# Patient Record
Sex: Female | Born: 1965 | Race: Black or African American | Hispanic: No | Marital: Single | Smoking: Never smoker
Health system: Southern US, Community
[De-identification: ages and names within clinical notes are randomized; demographics above are authoritative.]

## PROBLEM LIST (undated history)

## (undated) DIAGNOSIS — M1990 Unspecified osteoarthritis, unspecified site: Secondary | ICD-10-CM

## (undated) DIAGNOSIS — I10 Essential (primary) hypertension: Secondary | ICD-10-CM

## (undated) DIAGNOSIS — J45909 Unspecified asthma, uncomplicated: Secondary | ICD-10-CM

## (undated) HISTORY — DX: Unspecified osteoarthritis, unspecified site: M19.90

## (undated) HISTORY — DX: Essential (primary) hypertension: I10

## (undated) HISTORY — DX: Unspecified asthma, uncomplicated: J45.909

---

## 1989-10-28 HISTORY — PX: THYROIDECTOMY: SHX17

## 1998-09-20 ENCOUNTER — Emergency Department (HOSPITAL_COMMUNITY): Admission: EM | Admit: 1998-09-20 | Discharge: 1998-09-21 | Payer: Self-pay | Admitting: Emergency Medicine

## 2000-09-13 ENCOUNTER — Emergency Department (HOSPITAL_COMMUNITY): Admission: EM | Admit: 2000-09-13 | Discharge: 2000-09-13 | Payer: Self-pay | Admitting: Emergency Medicine

## 2005-01-25 ENCOUNTER — Emergency Department (HOSPITAL_COMMUNITY): Admission: EM | Admit: 2005-01-25 | Discharge: 2005-01-25 | Payer: Self-pay | Admitting: Family Medicine

## 2005-02-01 ENCOUNTER — Ambulatory Visit: Payer: Self-pay | Admitting: Family Medicine

## 2005-02-01 ENCOUNTER — Ambulatory Visit: Payer: Self-pay | Admitting: Internal Medicine

## 2005-02-14 ENCOUNTER — Ambulatory Visit: Payer: Self-pay | Admitting: Family Medicine

## 2005-02-21 ENCOUNTER — Ambulatory Visit: Payer: Self-pay | Admitting: Internal Medicine

## 2005-02-25 ENCOUNTER — Ambulatory Visit: Payer: Self-pay | Admitting: Family Medicine

## 2005-02-26 ENCOUNTER — Ambulatory Visit: Payer: Self-pay | Admitting: *Deleted

## 2005-03-01 ENCOUNTER — Ambulatory Visit: Payer: Self-pay | Admitting: Family Medicine

## 2005-03-18 ENCOUNTER — Ambulatory Visit: Payer: Self-pay | Admitting: Family Medicine

## 2005-04-26 ENCOUNTER — Ambulatory Visit: Payer: Self-pay | Admitting: Internal Medicine

## 2006-01-24 ENCOUNTER — Emergency Department (HOSPITAL_COMMUNITY): Admission: EM | Admit: 2006-01-24 | Discharge: 2006-01-24 | Payer: Self-pay | Admitting: *Deleted

## 2006-06-08 ENCOUNTER — Emergency Department (HOSPITAL_COMMUNITY): Admission: EM | Admit: 2006-06-08 | Discharge: 2006-06-08 | Payer: Self-pay | Admitting: Emergency Medicine

## 2006-07-16 ENCOUNTER — Ambulatory Visit: Payer: Self-pay | Admitting: Family Medicine

## 2006-10-10 ENCOUNTER — Ambulatory Visit: Payer: Self-pay | Admitting: Nurse Practitioner

## 2007-01-12 ENCOUNTER — Ambulatory Visit: Payer: Self-pay | Admitting: Family Medicine

## 2007-04-06 ENCOUNTER — Ambulatory Visit: Payer: Self-pay | Admitting: Internal Medicine

## 2007-07-15 ENCOUNTER — Encounter (INDEPENDENT_AMBULATORY_CARE_PROVIDER_SITE_OTHER): Payer: Self-pay | Admitting: *Deleted

## 2007-08-01 ENCOUNTER — Emergency Department (HOSPITAL_COMMUNITY): Admission: EM | Admit: 2007-08-01 | Discharge: 2007-08-01 | Payer: Self-pay | Admitting: Emergency Medicine

## 2007-10-20 ENCOUNTER — Ambulatory Visit: Payer: Self-pay | Admitting: Internal Medicine

## 2008-01-06 ENCOUNTER — Ambulatory Visit: Payer: Self-pay | Admitting: Family Medicine

## 2008-01-12 ENCOUNTER — Ambulatory Visit: Payer: Self-pay | Admitting: Internal Medicine

## 2008-01-12 ENCOUNTER — Encounter (INDEPENDENT_AMBULATORY_CARE_PROVIDER_SITE_OTHER): Payer: Self-pay | Admitting: Family Medicine

## 2008-01-12 LAB — CONVERTED CEMR LAB
ALT: 11 units/L (ref 0–35)
AST: 13 units/L (ref 0–37)
Albumin: 3.9 g/dL (ref 3.5–5.2)
Alkaline Phosphatase: 84 units/L (ref 39–117)
BUN: 13 mg/dL (ref 6–23)
Basophils Absolute: 0 10*3/uL (ref 0.0–0.1)
Basophils Relative: 0 % (ref 0–1)
CO2: 24 meq/L (ref 19–32)
Calcium: 9.1 mg/dL (ref 8.4–10.5)
Chloride: 102 meq/L (ref 96–112)
Cholesterol: 185 mg/dL (ref 0–200)
Creatinine, Ser: 0.8 mg/dL (ref 0.40–1.20)
Eosinophils Absolute: 0.2 10*3/uL (ref 0.0–0.7)
Eosinophils Relative: 2 % (ref 0–5)
Glucose, Bld: 97 mg/dL (ref 70–99)
HCT: 37.2 % (ref 36.0–46.0)
HDL: 45 mg/dL (ref >39)
Hemoglobin: 10.7 g/dL — ABNORMAL LOW (ref 12.0–15.0)
LDL Cholesterol: 122 mg/dL — ABNORMAL HIGH (ref 0–99)
Lymphocytes Relative: 30 % (ref 12–46)
Lymphs Abs: 2.6 10*3/uL (ref 0.7–4.0)
MCHC: 28.8 g/dL — ABNORMAL LOW (ref 30.0–36.0)
MCV: 72.8 fL — ABNORMAL LOW (ref 78.0–100.0)
Monocytes Absolute: 0.7 10*3/uL (ref 0.1–1.0)
Monocytes Relative: 8 % (ref 3–12)
Neutro Abs: 5.3 10*3/uL (ref 1.7–7.7)
Neutrophils Relative %: 60 % (ref 43–77)
Platelets: 319 10*3/uL (ref 150–400)
Potassium: 4.2 meq/L (ref 3.5–5.3)
RBC: 5.11 M/uL (ref 3.87–5.11)
RDW: 15.6 % — ABNORMAL HIGH (ref 11.5–15.5)
Sodium: 138 meq/L (ref 135–145)
T3, Total: 178.4 ng/dL (ref 80.0–204.0)
T4, Total: 8.5 ug/dL (ref 5.0–12.5)
TSH: 2.958 microintl units/mL (ref 0.350–5.50)
Total Bilirubin: 0.5 mg/dL (ref 0.3–1.2)
Total CHOL/HDL Ratio: 4.1
Total Protein: 7.3 g/dL (ref 6.0–8.3)
Triglycerides: 90 mg/dL (ref <150)
VLDL: 18 mg/dL (ref 0–40)
WBC: 8.7 10*3/uL (ref 4.0–10.5)

## 2008-01-29 ENCOUNTER — Encounter (INDEPENDENT_AMBULATORY_CARE_PROVIDER_SITE_OTHER): Payer: Self-pay | Admitting: Family Medicine

## 2008-01-29 ENCOUNTER — Ambulatory Visit: Payer: Self-pay | Admitting: Internal Medicine

## 2008-01-29 LAB — CONVERTED CEMR LAB
Ferritin: 36 ng/mL (ref 10–291)
Hgb A2 Quant: 2.7 % (ref 2.2–3.2)
Hgb A: 97.3 % (ref 96.8–97.8)
Hgb F Quant: 0 % (ref 0.0–2.0)
Hgb S Quant: 0 % (ref 0.0–0.0)
Iron: 33 ug/dL — ABNORMAL LOW (ref 42–145)
Saturation Ratios: 12 % — ABNORMAL LOW (ref 20–55)
TIBC: 282 ug/dL (ref 250–470)
UIBC: 249 ug/dL

## 2008-03-07 ENCOUNTER — Ambulatory Visit: Payer: Self-pay | Admitting: Internal Medicine

## 2008-03-07 ENCOUNTER — Encounter: Payer: Self-pay | Admitting: Family Medicine

## 2008-03-07 LAB — CONVERTED CEMR LAB
Chlamydia, DNA Probe: NEGATIVE
GC Probe Amp, Genital: NEGATIVE

## 2008-06-30 ENCOUNTER — Ambulatory Visit: Payer: Self-pay | Admitting: Internal Medicine

## 2008-07-07 ENCOUNTER — Emergency Department (HOSPITAL_COMMUNITY): Admission: EM | Admit: 2008-07-07 | Discharge: 2008-07-07 | Payer: Self-pay | Admitting: Family Medicine

## 2008-07-21 ENCOUNTER — Ambulatory Visit: Payer: Self-pay | Admitting: Internal Medicine

## 2009-01-25 ENCOUNTER — Ambulatory Visit: Payer: Self-pay | Admitting: Family Medicine

## 2009-01-26 ENCOUNTER — Encounter (INDEPENDENT_AMBULATORY_CARE_PROVIDER_SITE_OTHER): Payer: Self-pay | Admitting: Internal Medicine

## 2009-03-17 ENCOUNTER — Ambulatory Visit: Payer: Self-pay | Admitting: Family Medicine

## 2009-07-21 ENCOUNTER — Emergency Department (HOSPITAL_COMMUNITY): Admission: EM | Admit: 2009-07-21 | Discharge: 2009-07-21 | Payer: Self-pay | Admitting: Emergency Medicine

## 2009-11-27 ENCOUNTER — Emergency Department (HOSPITAL_COMMUNITY): Admission: EM | Admit: 2009-11-27 | Discharge: 2009-11-27 | Payer: Self-pay | Admitting: Family Medicine

## 2011-01-04 ENCOUNTER — Other Ambulatory Visit (HOSPITAL_COMMUNITY): Payer: Self-pay | Admitting: Family Medicine

## 2011-01-04 DIAGNOSIS — Z1231 Encounter for screening mammogram for malignant neoplasm of breast: Secondary | ICD-10-CM

## 2011-01-13 LAB — POCT URINALYSIS DIP (DEVICE)
Bilirubin Urine: NEGATIVE
Glucose, UA: NEGATIVE mg/dL
Ketones, ur: 15 mg/dL — AB
Nitrite: NEGATIVE
pH: 5.5 (ref 5.0–8.0)

## 2011-01-14 ENCOUNTER — Ambulatory Visit (HOSPITAL_COMMUNITY)
Admission: RE | Admit: 2011-01-14 | Discharge: 2011-01-14 | Disposition: A | Discharge status: Home / Self Care | Payer: Self-pay | Source: Ambulatory Visit | Attending: Family Medicine | Admitting: Family Medicine

## 2011-01-14 DIAGNOSIS — Z1231 Encounter for screening mammogram for malignant neoplasm of breast: Secondary | ICD-10-CM | POA: Insufficient documentation

## 2011-02-20 ENCOUNTER — Other Ambulatory Visit: Payer: Self-pay | Admitting: Family Medicine

## 2011-05-26 ENCOUNTER — Emergency Department (HOSPITAL_COMMUNITY)
Admission: EM | Admit: 2011-05-26 | Discharge: 2011-05-27 | Disposition: A | Discharge status: Home / Self Care | Payer: Self-pay | Attending: Emergency Medicine | Admitting: Emergency Medicine

## 2011-05-26 DIAGNOSIS — N751 Abscess of Bartholin's gland: Secondary | ICD-10-CM | POA: Insufficient documentation

## 2011-06-03 ENCOUNTER — Emergency Department (HOSPITAL_COMMUNITY)
Admission: EM | Admit: 2011-06-03 | Discharge: 2011-06-03 | Disposition: A | Discharge status: Home / Self Care | Payer: Self-pay | Attending: Emergency Medicine | Admitting: Emergency Medicine

## 2011-06-03 DIAGNOSIS — Z09 Encounter for follow-up examination after completed treatment for conditions other than malignant neoplasm: Secondary | ICD-10-CM | POA: Insufficient documentation

## 2011-09-24 ENCOUNTER — Emergency Department (INDEPENDENT_AMBULATORY_CARE_PROVIDER_SITE_OTHER): Payer: Self-pay

## 2011-09-24 ENCOUNTER — Encounter: Payer: Self-pay | Admitting: Cardiology

## 2011-09-24 ENCOUNTER — Emergency Department (INDEPENDENT_AMBULATORY_CARE_PROVIDER_SITE_OTHER)
Admission: EM | Admit: 2011-09-24 | Discharge: 2011-09-24 | Disposition: A | Discharge status: Home / Self Care | Payer: Self-pay | Source: Home / Self Care | Attending: Emergency Medicine | Admitting: Emergency Medicine

## 2011-09-24 DIAGNOSIS — R6889 Other general symptoms and signs: Secondary | ICD-10-CM

## 2011-09-24 MED ORDER — BENZONATATE 200 MG PO CAPS: 200.0000 mg | ORAL_CAPSULE | Freq: Three times a day (TID) | ORAL | Status: AC | PRN

## 2011-09-24 MED ORDER — OSELTAMIVIR PHOSPHATE 75 MG PO CAPS: 75.0000 mg | ORAL_CAPSULE | Freq: Two times a day (BID) | ORAL | Status: AC

## 2011-09-24 MED ORDER — TRAMADOL HCL 50 MG PO TABS: 100.0000 mg | ORAL_TABLET | Freq: Three times a day (TID) | ORAL | Status: AC | PRN

## 2011-09-24 NOTE — ED Provider Notes (Signed)
History     CSN: [id number] Arrival date & time: 09/24/2011  8:27 PM   First MD Initiated Contact with Patient 09/24/11 1756      No chief complaint on file.   (Consider location/radiation/quality/duration/timing/severity/associated sxs/prior treatment) HPI Comments: Jodi Hunt is a two-day history chills, fever of up to 102.2, muscle aches, cough productive yellow sputum, scratchy throat, nasal congestion green drainage, and vomiting. Her sister has the same symptoms. She has not had a flu vaccine.   History reviewed. No pertinent past medical history.  Past Surgical History  Procedure Date  . Thyroidectomy 1991    History reviewed. No pertinent family history.  History  Substance Use Topics  . Smoking status: Not on file  . Smokeless tobacco: Not on file  . Alcohol Use:     OB History    Grav Para Term Preterm Abortions TAB SAB Ect Mult Living                  Review of Systems  Constitutional: Positive for fever, chills and fatigue.  HENT: Positive for congestion, sore throat and rhinorrhea. Negative for ear pain, sneezing, neck stiffness, voice change and postnasal drip.   Eyes: Negative for pain, discharge and redness.  Respiratory: Positive for cough. Negative for chest tightness, shortness of breath and wheezing.   Gastrointestinal: Positive for vomiting. Negative for nausea, abdominal pain and diarrhea.  Musculoskeletal: Positive for myalgias.  Skin: Negative for rash.  Neurological: Positive for headaches.    Allergies  Review of patient's allergies indicates no known allergies.  Home Medications   Current Outpatient Rx  Name Route Sig Dispense Refill  . ACETAMINOPHEN 500 MG PO TABS Oral Take 500 mg by mouth every 4 (four) hours as needed. 2 tablets every 4 hours as needed for fever/pain     . BENZONATATE 200 MG PO CAPS Oral Take 1 capsule (200 mg total) by mouth 3 (three) times daily as needed for cough. 30 capsule 0  . OSELTAMIVIR PHOSPHATE 75 MG  PO CAPS Oral Take 1 capsule (75 mg total) by mouth every 12 (twelve) hours. 10 capsule 0  . TRAMADOL HCL 50 MG PO TABS Oral Take 2 tablets (100 mg total) by mouth every 8 (eight) hours as needed for pain. Maximum dose= 8 tablets per day 30 tablet 0    BP 123/91  Pulse 112  Temp(Src) 99.5 F (37.5 C) (Oral)  Resp 20  SpO2 100%  LMP 08/29/2011  Physical Exam  Nursing note and vitals reviewed. Constitutional: She appears well-developed and well-nourished. No distress.  HENT:  Head: Normocephalic and atraumatic.  Right Ear: External ear normal.  Left Ear: External ear normal.  Nose: Nose normal.  Mouth/Throat: Oropharynx is clear and moist. No oropharyngeal exudate.  Eyes: Conjunctivae and EOM are normal. Pupils are equal, round, and reactive to light. Right eye exhibits no discharge. Left eye exhibits no discharge.  Neck: Normal range of motion. Neck supple.  Cardiovascular: Normal rate, regular rhythm and normal heart sounds.   Pulmonary/Chest: Effort normal and breath sounds normal. No stridor. No respiratory distress. She has no wheezes. She has no rales. She exhibits no tenderness.  Lymphadenopathy:    She has no cervical adenopathy.  Skin: Skin is warm and dry. No rash noted. She is not diaphoretic.    ED Course  Procedures (including critical care time)  Labs Reviewed - No data to display Dg Chest 2 View  09/24/2011  *RADIOLOGY REPORT*  Clinical Data: Cough.  Fever.  Body aches.  CHEST - 2 VIEW  Comparison: None.  Findings: Cardiac and mediastinal contours appear normal.  The lungs appear clear.  No pleural effusion is identified.  IMPRESSION:  No significant abnormality identified.  Original Report Authenticated By: Dellia Cloud, M.D.     1. Influenza-like illness       MDM  She has a flulike illness and we'll go ahead and treat with Tamiflu.        Roque Lias, MD 09/24/11 2214

## 2011-09-24 NOTE — ED Notes (Signed)
Pt reports body aches, fever, sore/scratchy throat, productive yellow cough, and nasal congestion. Symptoms started Sunday night. Pt has not taken tylenol today but did yesterday.  Tolerating po liquids. Poor appetite.

## 2012-02-05 IMAGING — MG MM DIGITAL SCREENING {WH}
7 series · 7 of 7 positions shown · non-contrast
Comparison: none

DG SCREEN MAMMOGRAM BILATERAL
Bilateral CC and MLO view(s) were taken.
Technologist: Lavren Zh, RT RM

DIGITAL SCREENING MAMMOGRAM WITH CAD:
There are scattered fibroglandular densities.  No masses or malignant type calcifications are 
identified.
Images were processed with CAD.

[R CC]
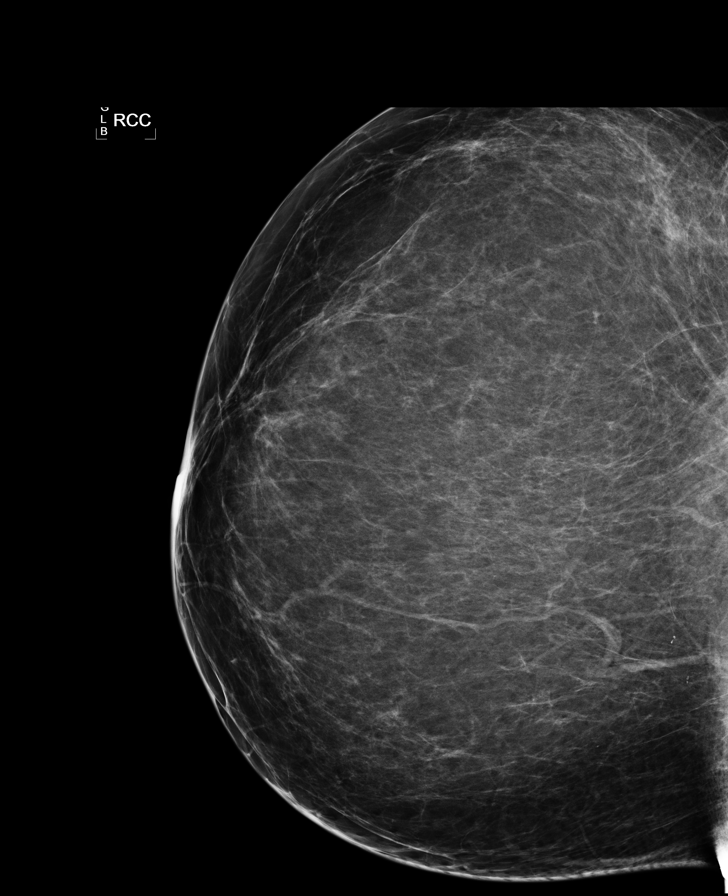

[R MLO (1 of 2)]
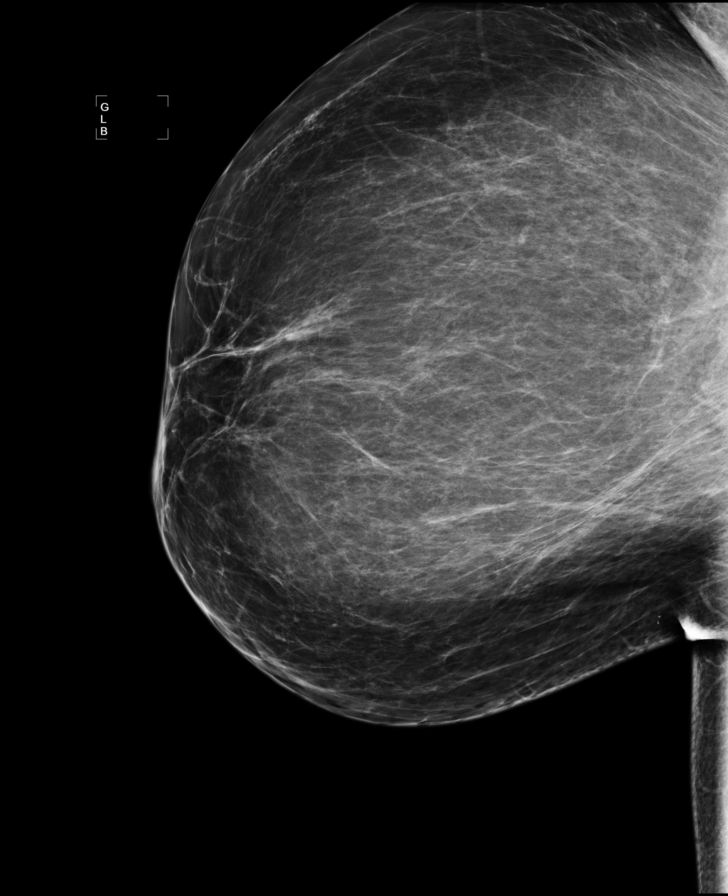

[L CC]
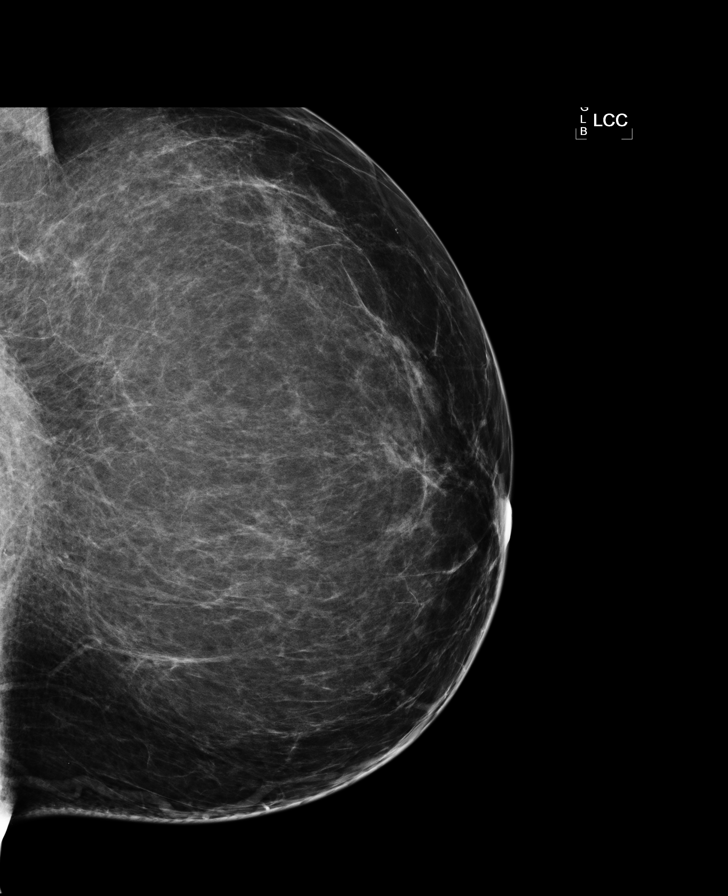

[L MLO (1 of 3)]
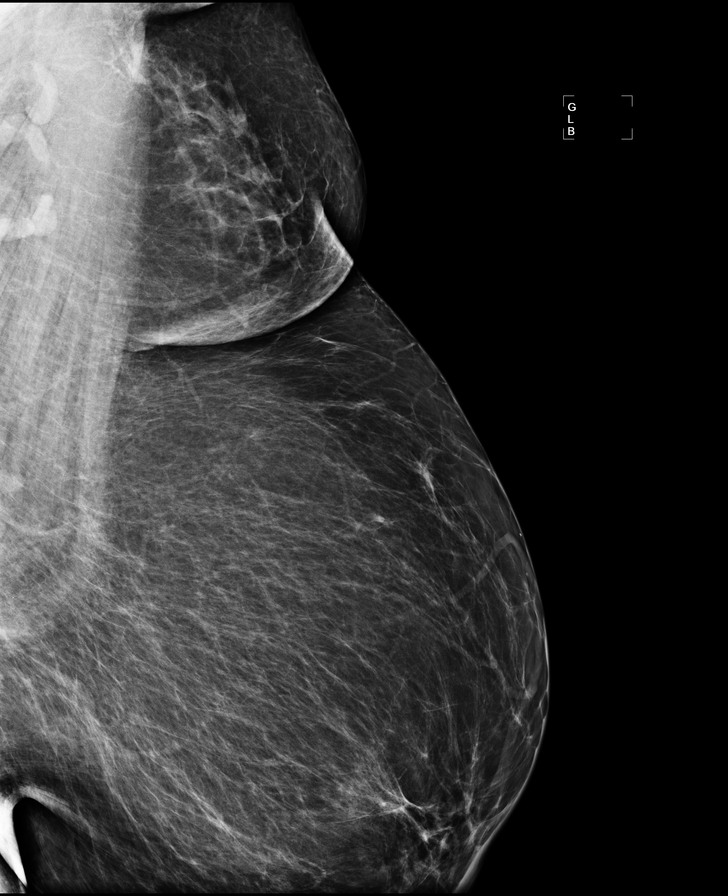

[L MLO (2 of 3)]
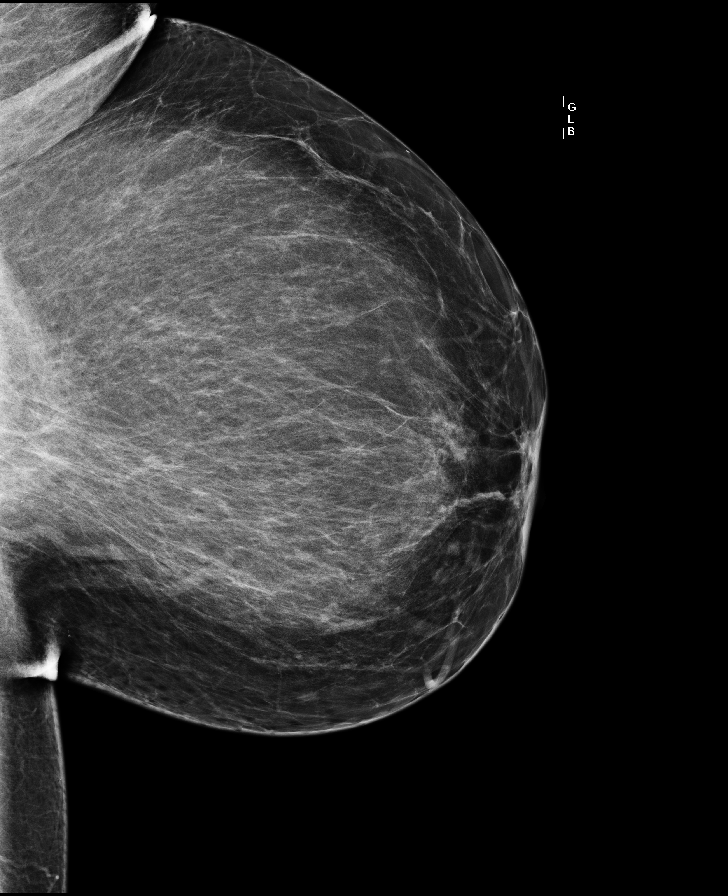

[R MLO (2 of 2)]
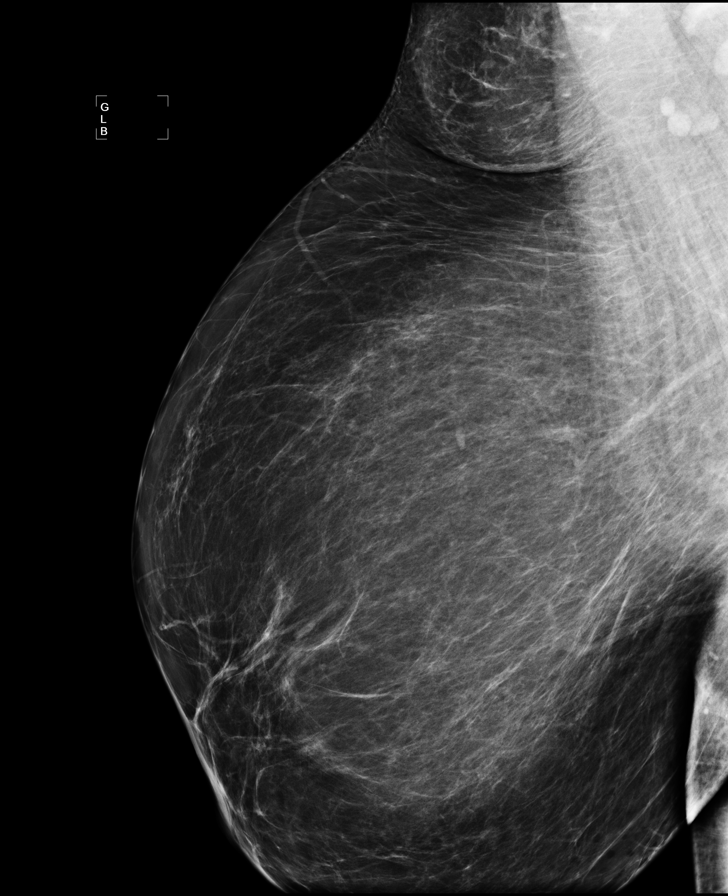

[L MLO (3 of 3)]
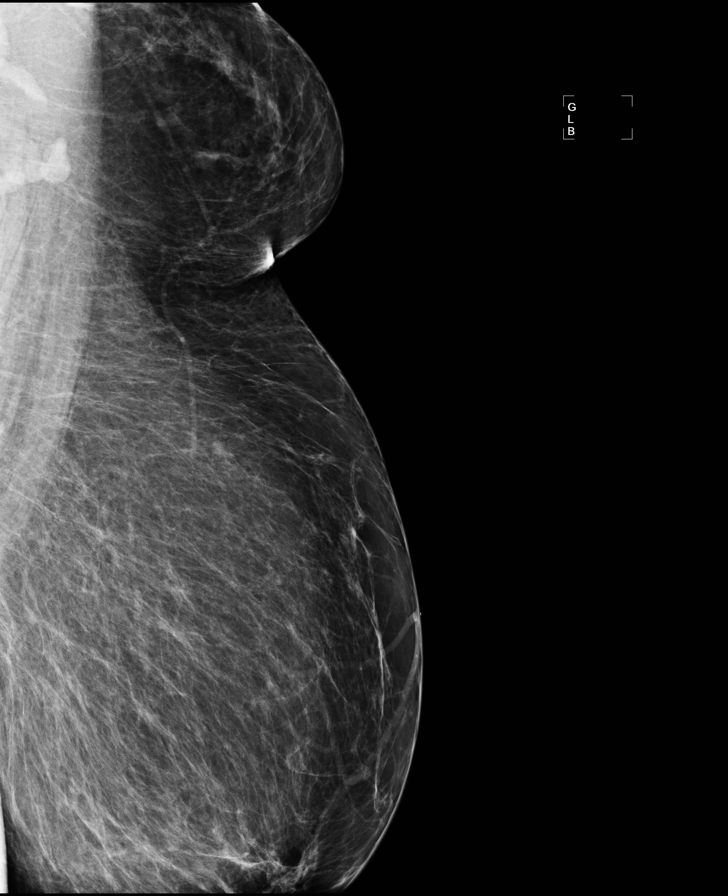

[7 of 7 positions shown; findings below may reference images not displayed]

IMPRESSION: No specific mammographic evidence of malignancy.  Next screening mammogram is recommended in one 
year.

A result letter of this screening mammogram will be mailed directly to the patient.

ASSESSMENT: Negative - BI-RADS 1

Screening mammogram in 1 year.
,

## 2012-10-15 IMAGING — CR DG CHEST 2V
2 series · 2 of 2 positions shown · non-contrast
Comparison: None.

CLINICAL DATA: Cough.  Fever.  Body aches.

CHEST - 2 VIEW

[view not recorded (1 of 2)]
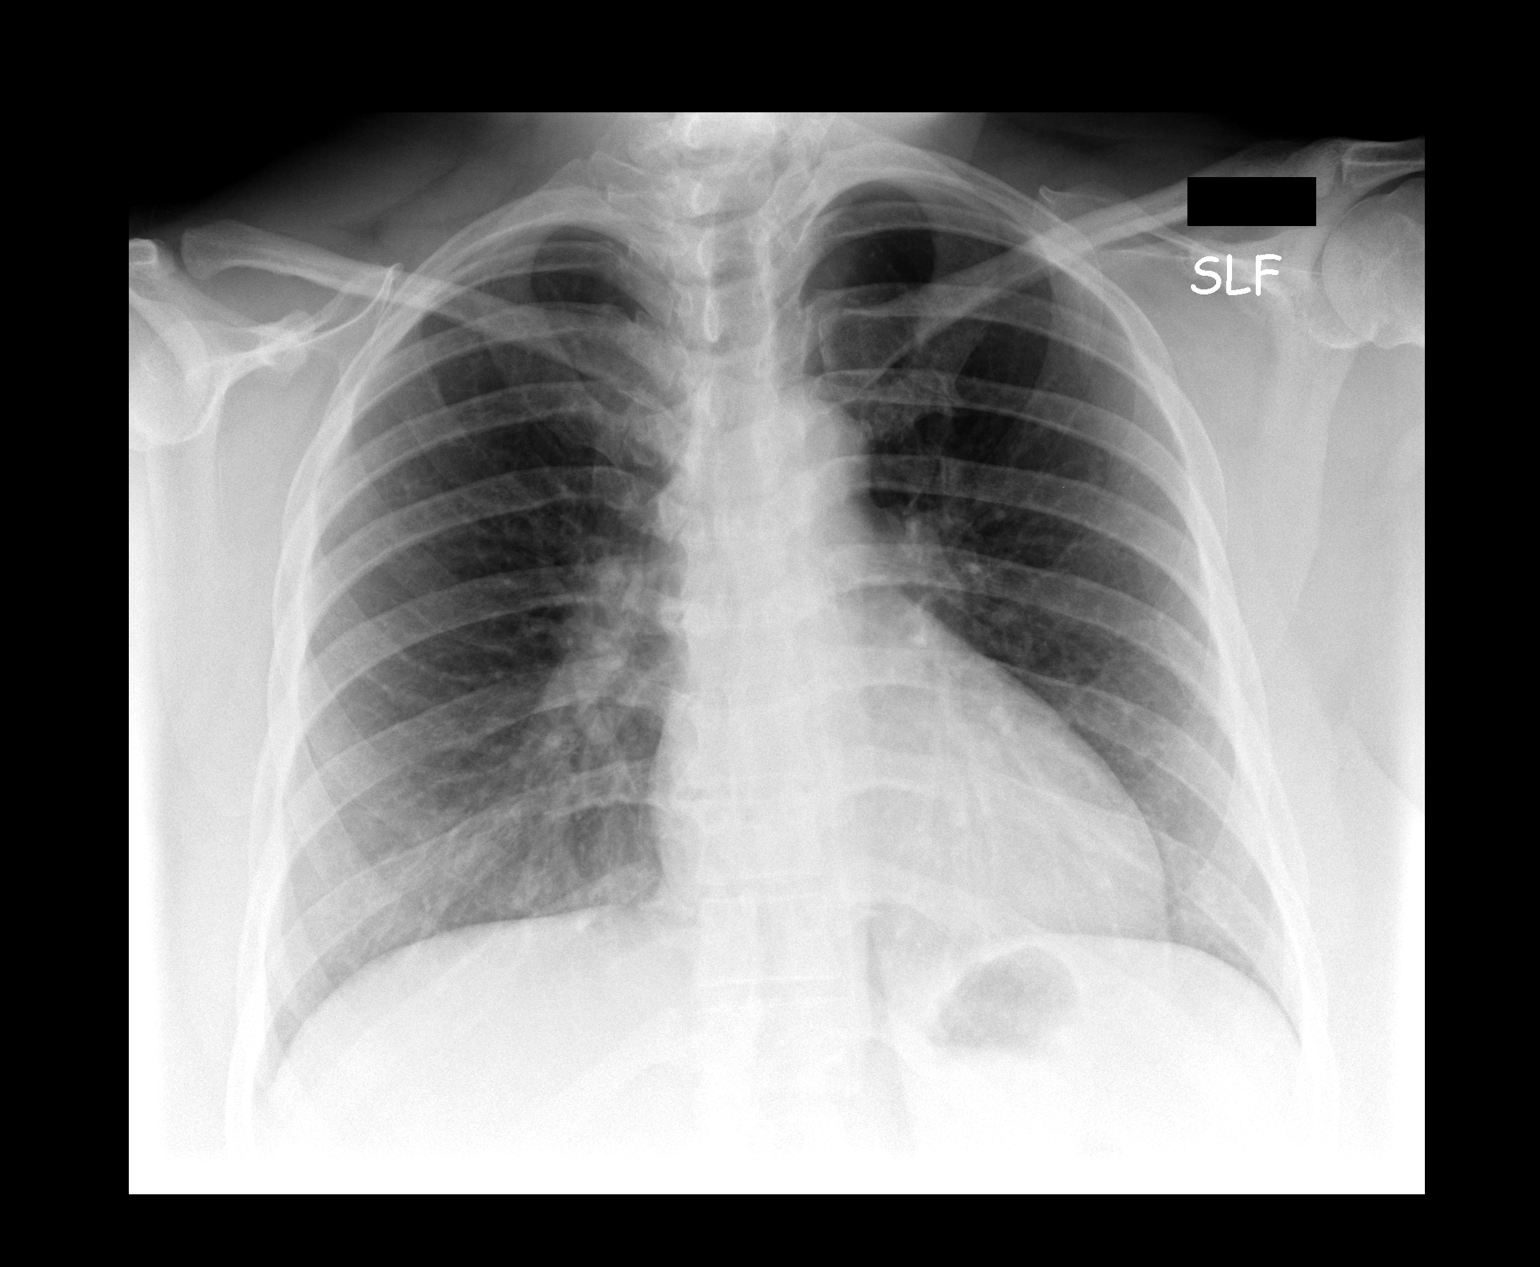

[view not recorded (2 of 2)]
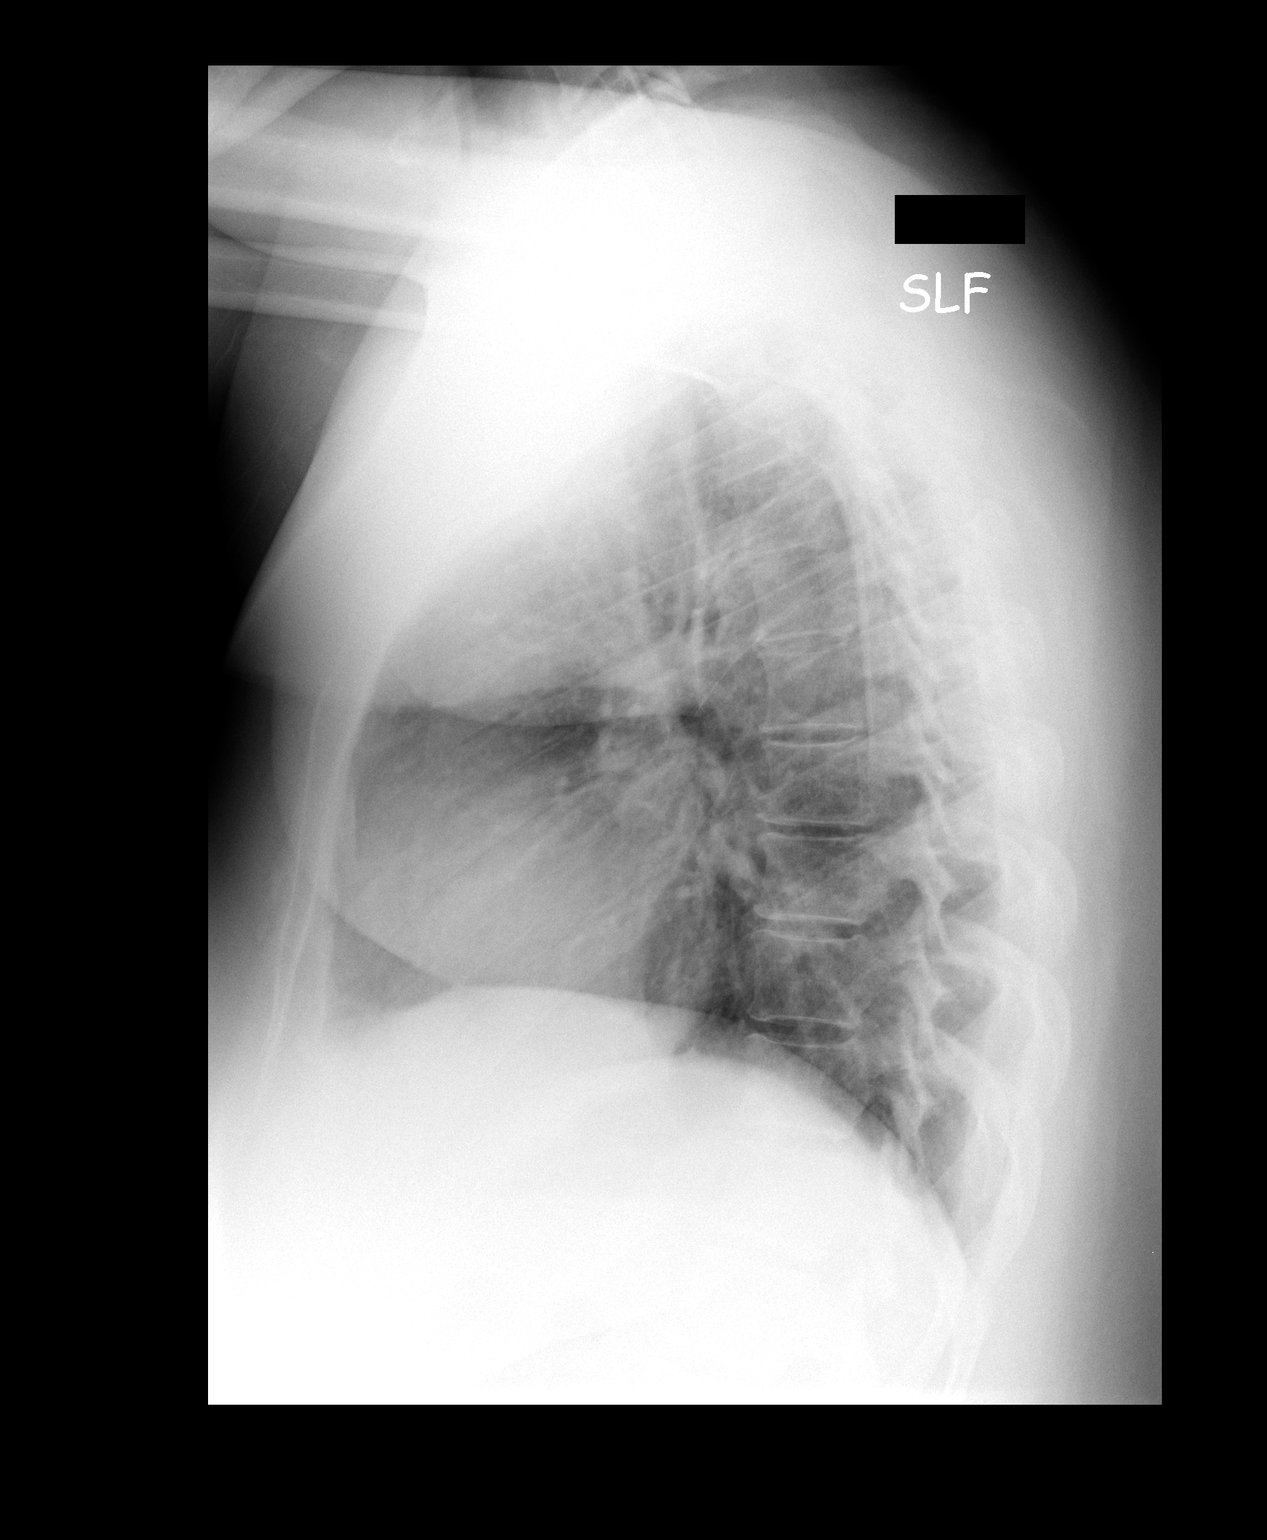

[2 of 2 positions shown; findings below may reference images not displayed]

FINDINGS: Cardiac and mediastinal contours appear normal.

The lungs appear clear.

No pleural effusion is identified.
IMPRESSION: No significant abnormality identified.

## 2012-12-14 ENCOUNTER — Encounter (HOSPITAL_COMMUNITY): Payer: Self-pay | Admitting: *Deleted

## 2012-12-14 ENCOUNTER — Emergency Department (HOSPITAL_COMMUNITY): Payer: Self-pay

## 2012-12-14 ENCOUNTER — Emergency Department (HOSPITAL_COMMUNITY)
Admission: EM | Admit: 2012-12-14 | Discharge: 2012-12-14 | Disposition: A | Discharge status: Home / Self Care | Payer: Self-pay | Attending: Emergency Medicine | Admitting: Emergency Medicine

## 2012-12-14 DIAGNOSIS — R5381 Other malaise: Secondary | ICD-10-CM | POA: Insufficient documentation

## 2012-12-14 DIAGNOSIS — J40 Bronchitis, not specified as acute or chronic: Secondary | ICD-10-CM

## 2012-12-14 DIAGNOSIS — J209 Acute bronchitis, unspecified: Secondary | ICD-10-CM | POA: Insufficient documentation

## 2012-12-14 DIAGNOSIS — R509 Fever, unspecified: Secondary | ICD-10-CM | POA: Insufficient documentation

## 2012-12-14 DIAGNOSIS — J3489 Other specified disorders of nose and nasal sinuses: Secondary | ICD-10-CM | POA: Insufficient documentation

## 2012-12-14 MED ORDER — ALBUTEROL SULFATE HFA 108 (90 BASE) MCG/ACT IN AERS
2.0000 | INHALATION_SPRAY | Freq: Once | RESPIRATORY_TRACT | Status: AC
  Administered 2012-12-14: 2 via RESPIRATORY_TRACT
  Filled 2012-12-14: qty 6.7

## 2012-12-14 MED ORDER — PREDNISONE 10 MG PO TABS: ORAL_TABLET | ORAL | Status: DC

## 2012-12-14 MED ORDER — BENZONATATE 100 MG PO CAPS: 100.0000 mg | ORAL_CAPSULE | Freq: Three times a day (TID) | ORAL | Status: AC

## 2012-12-14 MED ORDER — AZITHROMYCIN 250 MG PO TABS
500.0000 mg | ORAL_TABLET | Freq: Once | ORAL | Status: AC
  Administered 2012-12-14: 500 mg via ORAL
  Filled 2012-12-14: qty 2

## 2012-12-14 MED ORDER — PREDNISONE 20 MG PO TABS
60.0000 mg | ORAL_TABLET | Freq: Once | ORAL | Status: AC
  Administered 2012-12-14: 60 mg via ORAL
  Filled 2012-12-14: qty 3

## 2012-12-14 MED ORDER — AZITHROMYCIN 250 MG PO TABS: ORAL_TABLET | ORAL | Status: DC

## 2012-12-14 NOTE — ED Notes (Signed)
Pt in c/o cough and congestion over last week, had a fever earlier in the week but none in last two days, states in due to continued coughing

## 2012-12-14 NOTE — ED Provider Notes (Signed)
History  This chart was scribed for non-physician practitioner, Lottie Mussel, PA working with Vida Roller, MD by Shari Heritage, ED Scribe. This patient was seen in room WTR9/WTR9 and the patient's care was started at 2059.   CSN: [id number]  Arrival date & time 12/14/12  2022   First MD Initiated Contact with Patient 12/14/12 2059      Chief Complaint  Patient presents with  . Cough     Patient is a 47 y.o. female presenting with cough. The history is provided by the patient. No language interpreter was used.  Cough Cough characteristics:  Productive Sputum characteristics:  Yellow Severity:  Moderate Onset quality:  Gradual Duration:  1 week Timing:  Constant Progression:  Unchanged Chronicity:  New Smoker: no   Relieved by:  Nothing Associated symptoms: fever and rhinorrhea   Associated symptoms: no shortness of breath   Fever:    Timing:  Intermittent   Temp source:  Subjective Rhinorrhea:    Quality:  Yellow   Severity:  Mild   Duration:  1 week   Timing:  Constant    HPI Comments: Jodi Hunt is a 47 y.o. female who presents to the Emergency Department complaining of persistent, moderate, productive cough onset 1 week ago. Cough is productive of yellow sputum. There is associated congestion, sinus pressure, rhinorrhea, intermittent subjective fever and generalized weakness. Patient states that she hasn't had a fever for the past couple of days. Patient denies shortness of breath, nausea, vomiting or diarrhea. No leg swelling. She has taken Nyquil, Alka Seltzer Cold, Tylenol Cold and Flu and other cough suppressants and decongestants.  Patient has no history of asthma or lung problems. She does not smoke.   History reviewed. No pertinent past medical history.  Past Surgical History  Procedure Laterality Date  . Thyroidectomy  1991    History reviewed. No pertinent family history.  History  Substance Use Topics  . Smoking status: Not on file   . Smokeless tobacco: Not on file  . Alcohol Use:     OB History   Grav Para Term Preterm Abortions TAB SAB Ect Mult Living                  Review of Systems  Constitutional: Positive for fever.  HENT: Positive for congestion, rhinorrhea and sinus pressure.   Respiratory: Positive for cough. Negative for shortness of breath.   Cardiovascular: Negative for leg swelling.  Gastrointestinal: Negative for nausea, vomiting and diarrhea.  All other systems reviewed and are negative.    Allergies  Review of patient's allergies indicates no known allergies.  Home Medications  No current outpatient prescriptions on file.  Triage Vitals: BP 142/87  Pulse 95  Temp(Src) 98.6 F (37 C) (Oral)  Resp 20  SpO2 100%  LMP 11/28/2012  Physical Exam  Constitutional: She is oriented to person, place, and time. She appears well-developed and well-nourished.  HENT:  Head: Normocephalic and atraumatic.  Right Ear: Tympanic membrane, external ear and ear canal normal.  Left Ear: Tympanic membrane, external ear and ear canal normal.  Nose: Right sinus exhibits maxillary sinus tenderness and frontal sinus tenderness. Left sinus exhibits maxillary sinus tenderness and frontal sinus tenderness.  Mouth/Throat: Posterior oropharyngeal erythema present. No oropharyngeal exudate or posterior oropharyngeal edema.  Erythematous tonsils. Tonsils are not enlarged.   Cardiovascular: Normal rate, regular rhythm and normal heart sounds.   No murmur heard. Pulmonary/Chest: Effort normal and breath sounds normal. No respiratory distress. She has  no wheezes. She has no rales.  Neurological: She is alert and oriented to person, place, and time.    ED Course  Procedures (including critical care time) DIAGNOSTIC STUDIES: Oxygen Saturation is 100% on room air, normal by my interpretation.    COORDINATION OF CARE: 9:27 PM- Patient informed of current plan for treatment and evaluation and agrees with plan at  this time.      Chest 2 View  12/14/2012  *RADIOLOGY REPORT*  Clinical Data: Cough, short of breath  CHEST - 2 VIEW  Comparison: Prior chest x-ray 09/24/2011  Findings: The lungs are well-aerated and free from pulmonary edema, focal airspace consolidation or pulmonary nodule.  Cardiac and mediastinal contours are within normal limits.  No pneumothorax, or pleural effusion. No acute osseous findings.  IMPRESSION:  No acute cardiopulmonary disease.   Original Report Authenticated By: Malachy Moan, M.D.      Labs Reviewed - No data to display No results found.   1. Bronchitis       MDM  PT with cough, congestion for 1 week. CXR negative. Pt with dry constant cough in ED. Lungs clear. Mild improvement with albuterol. Will cover with zithromax for atypical infection, inhaler at home, steroid course. Follow up with PCP. VS normal.   Filed Vitals:   12/14/12 2032  BP: 142/87  Pulse: 95  Temp: 98.6 F (37 C)  Resp: 20        I personally performed the services described in this documentation, which was scribed in my presence. The recorded information has been reviewed and is accurate.    Lottie Mussel, PA 12/14/12 2244

## 2012-12-15 NOTE — ED Provider Notes (Signed)
  Medical screening examination/treatment/procedure(s) were performed by non-physician practitioner and as supervising physician I was immediately available for consultation/collaboration.    Willi Borowiak D Arvel Oquinn, MD 12/15/12 0024 

## 2013-06-27 ENCOUNTER — Encounter (HOSPITAL_COMMUNITY): Payer: Self-pay | Admitting: Emergency Medicine

## 2013-06-27 ENCOUNTER — Emergency Department (HOSPITAL_COMMUNITY)
Admission: EM | Admit: 2013-06-27 | Discharge: 2013-06-27 | Disposition: A | Discharge status: Home / Self Care | Payer: BC Managed Care – PPO | Source: Home / Self Care

## 2013-06-27 DIAGNOSIS — L259 Unspecified contact dermatitis, unspecified cause: Secondary | ICD-10-CM

## 2013-06-27 MED ORDER — TRIAMCINOLONE ACETONIDE 40 MG/ML IJ SUSP
40.0000 mg | Freq: Once | INTRAMUSCULAR | Status: AC
  Administered 2013-06-27: 40 mg via INTRAMUSCULAR

## 2013-06-27 MED ORDER — TRIAMCINOLONE ACETONIDE 40 MG/ML IJ SUSP
INTRAMUSCULAR | Status: AC
  Filled 2013-06-27: qty 1

## 2013-06-27 MED ORDER — PREDNISONE 20 MG PO TABS: ORAL_TABLET | ORAL | Status: DC

## 2013-06-27 MED ORDER — TRIAMCINOLONE ACETONIDE 0.1 % EX CREA: TOPICAL_CREAM | CUTANEOUS | Status: DC

## 2013-06-27 NOTE — ED Provider Notes (Signed)
Medical screening examination/treatment/procedure(s) were performed by non-physician practitioner and as supervising physician I was immediately available for consultation/collaboration.  Karimah Winquist, M.D.  Leonardo Plaia C Lashika Erker, MD 06/27/13 2002 

## 2013-06-27 NOTE — ED Notes (Signed)
C/o rash for over a week now.  No medications taken.

## 2013-06-27 NOTE — ED Provider Notes (Signed)
CSN: [id number]     Arrival date & time 06/27/13  1843 History   None    Chief Complaint  Patient presents with  . Rash   (Consider location/radiation/quality/duration/timing/severity/associated sxs/prior Treatment) HPI Comments: 47 year old female presents with an itchy rash for one week. She states she developed pruritus to various skin surface areas and approximately  3-4 days later she developed a papular rash scattered about the arms, ,  proximal thighs and toso.  She has pruritus on her torso, however I do not see a rash there. She is unaware of the cause. She denies being outside or around poison ivy or other known poisonous  plants. She has no trouble breathing, no cough, wheezing or other symptoms.  Patient is a 47 y.o. female presenting with rash.  Rash   History reviewed. No pertinent past medical history. Past Surgical History  Procedure Laterality Date  . Thyroidectomy  1991   History reviewed. No pertinent family history. History  Substance Use Topics  . Smoking status: Not on file  . Smokeless tobacco: Not on file  . Alcohol Use:    OB History   Grav Para Term Preterm Abortions TAB SAB Ect Mult Living                 Review of Systems  Constitutional: Negative.   HENT: Negative.   Respiratory: Negative.   Cardiovascular: Negative.   Gastrointestinal: Negative.   Skin: Positive for rash.  Neurological: Negative.   Psychiatric/Behavioral: Negative.     Allergies  Review of patient's allergies indicates no known allergies.  Home Medications   Current Outpatient Rx  Name  Route  Sig  Dispense  Refill  . azithromycin (ZITHROMAX) 250 MG tablet      One tab PO QD   4 tablet   0   . benzonatate (TESSALON) 100 MG capsule   Oral   Take 1 capsule (100 mg total) by mouth every 8 (eight) hours.   21 capsule   0   . dextromethorphan (DELSYM) 30 MG/5ML liquid   Oral   Take 60 mg by mouth as needed for cough.         Marland Kitchen DM-Phenylephrine-Acetaminophen  (ALKA-SELTZER PLUS DAY COLD/FLU PO)   Oral   Take by mouth.         Marland Kitchen OVER THE COUNTER MEDICATION   Oral   Take 1 tablet by mouth daily as needed (congestion.). Over the counter decongestant.         . Phenylephrine-DM-GG-APAP (TYLENOL COLD MULTI-SYMPTOM) 5-10-200-325 MG TABS   Oral   Take by mouth.         . predniSONE (DELTASONE) 10 MG tablet      Take 5 tab day 1, take 4 tab day 2, take 3 tab day 3, take 2 tab day 4, and take 1 tab day 5   15 tablet   0   . predniSONE (DELTASONE) 20 MG tablet      Take 3 tabs po on first day, 2 tabs second day, 2 tabs third day, 1 tab fourth day, 1 tab 5th day. Take with food.   9 tablet   0   . Pseudoeph-Doxylamine-DM-APAP (NYQUIL) 60-7.03-26-999 MG/30ML LIQD   Oral   Take 30 mLs by mouth daily as needed.         . triamcinolone cream (KENALOG) 0.1 %      Apply to affected areas 2 or 3 times a day as directed   30 g  0    BP 123/74  Pulse 93  Temp(Src) 99.2 F (37.3 C) (Oral)  Resp 18  SpO2 98%  LMP 06/13/2013 Physical Exam  Nursing note and vitals reviewed. Constitutional: She is oriented to person, place, and time. She appears well-developed and well-nourished. No distress.  HENT:  Mouth/Throat: Oropharynx is clear and moist. No oropharyngeal exudate.  Eyes: Conjunctivae and EOM are normal.  Neck: Normal range of motion. Neck supple.  Cardiovascular: Normal rate, regular rhythm and normal heart sounds.   Pulmonary/Chest: Effort normal and breath sounds normal. No respiratory distress. She has no wheezes. She has no rales.  Neurological: She is alert and oriented to person, place, and time.  Skin: Skin is warm and dry. Rash noted.  Papular rash to skin areas as described above. No drainage or pustules.  Psychiatric: She has a normal mood and affect.    ED Course  Procedures (including critical care time) Labs Review Labs Reviewed - No data to display Imaging Review No results found.  MDM   1. Contact  dermatitis    Kenalog 40 mg IM Start tomorrow prednisone as directed Benadryl gell and triamcinolone cream as directed    Hayden Rasmussen, NP 06/27/13 1937

## 2014-01-05 IMAGING — CR DG CHEST 2V
2 series · 2 of 2 positions shown · non-contrast
Comparison: Prior chest x-ray 09/24/2011

CLINICAL DATA: Cough, short of breath

CHEST - 2 VIEW

[w chest pa]
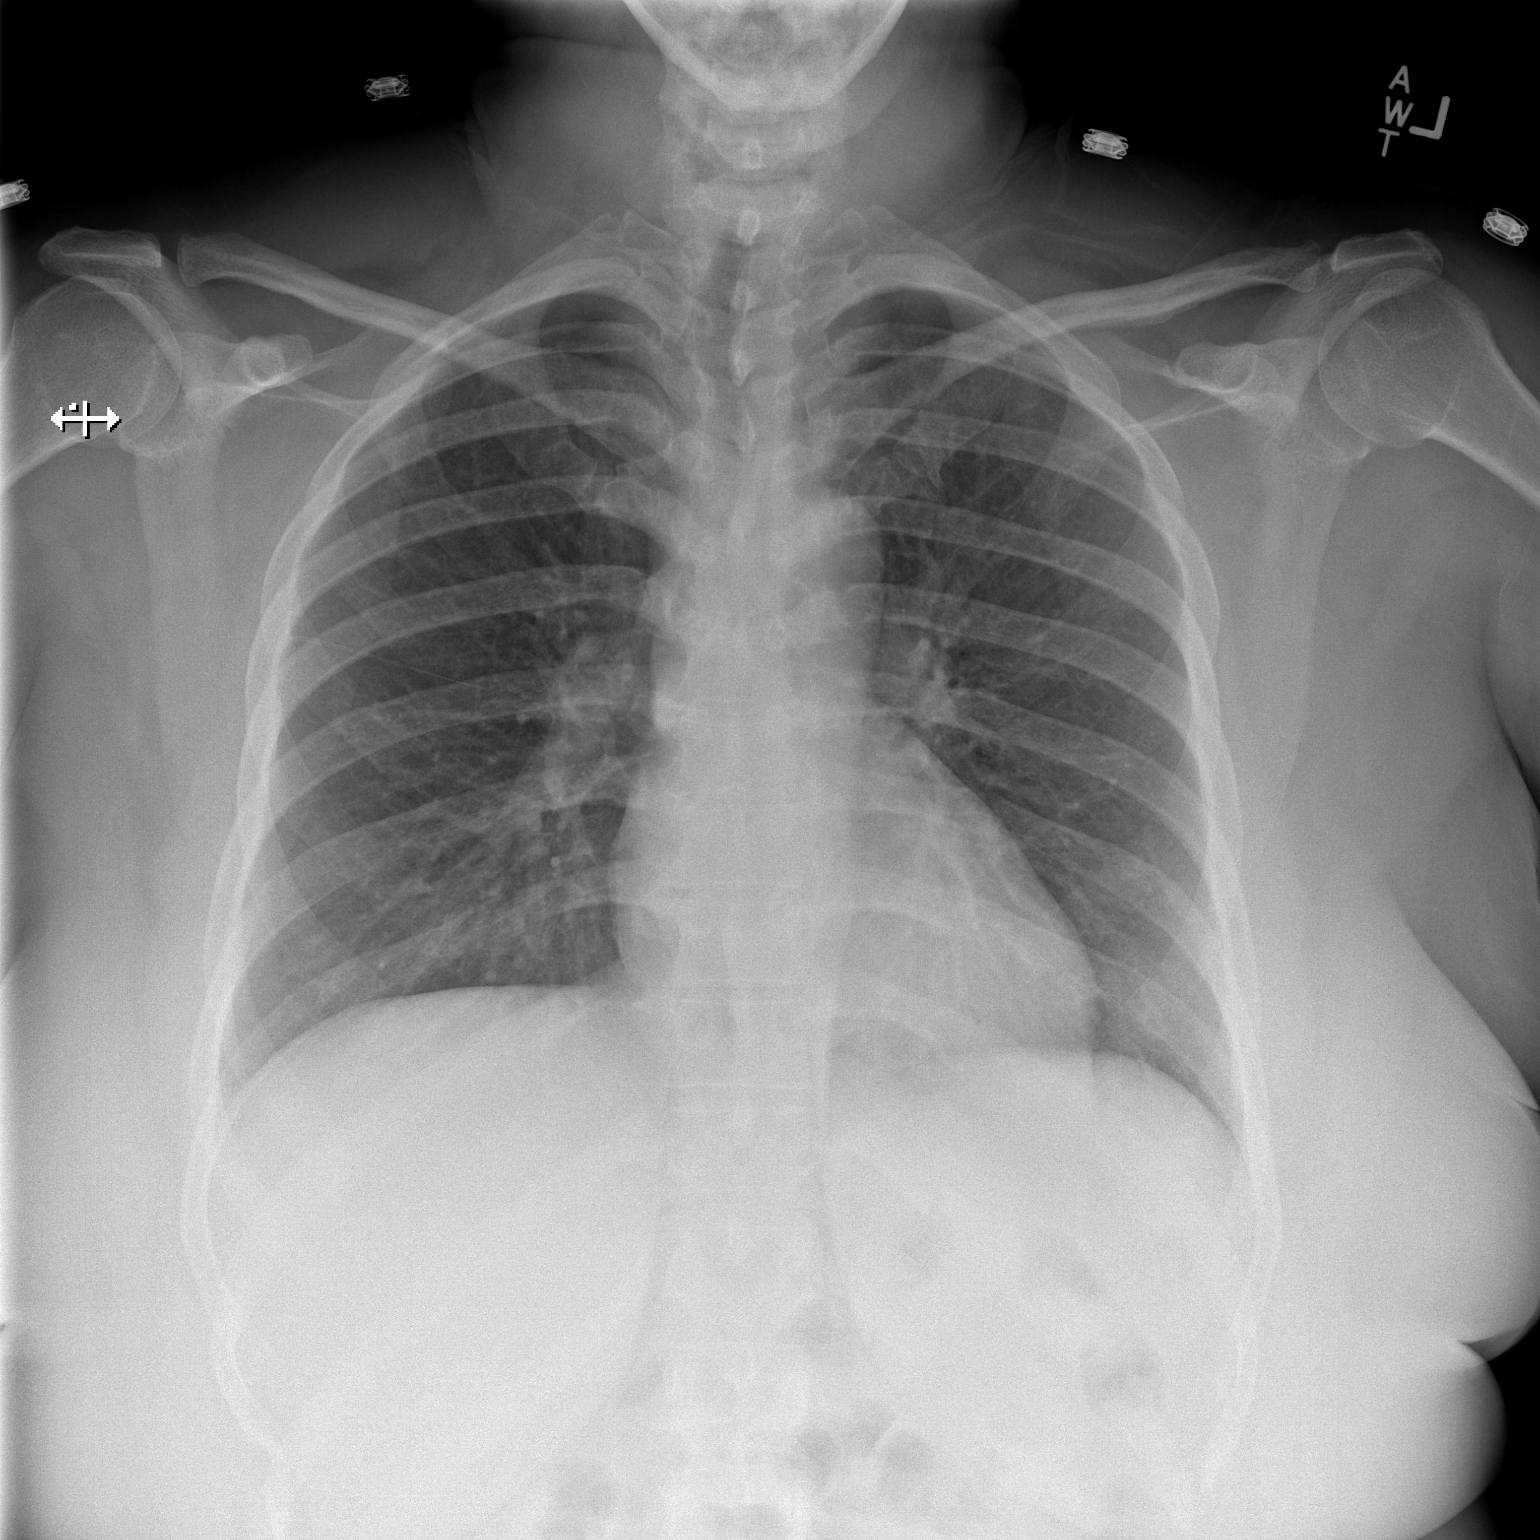

[w chest lat]
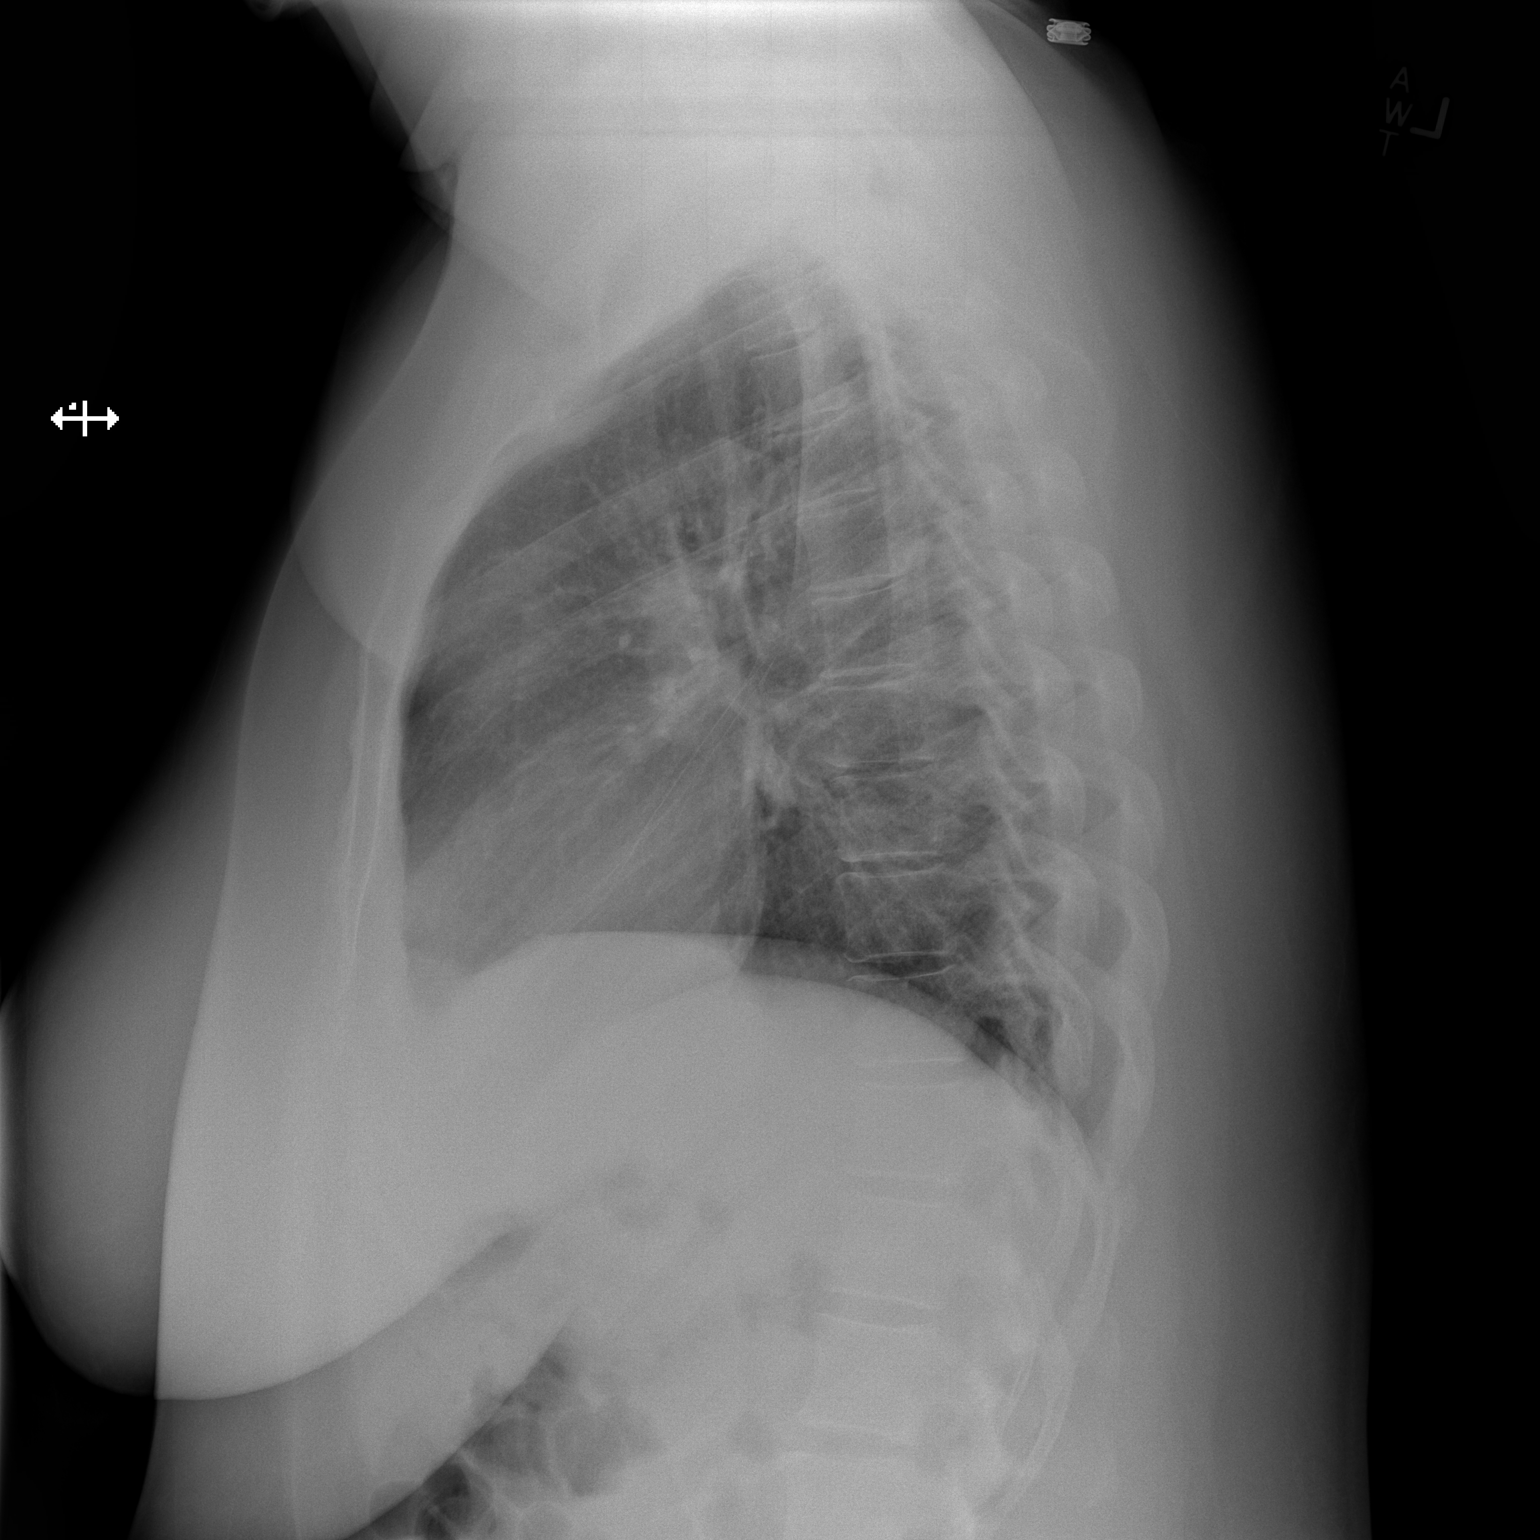

[2 of 2 positions shown; findings below may reference images not displayed]

FINDINGS: [The lungs are well-aerated and free from pulmonary
edema, focal airspace consolidation or pulmonary nodule.  Cardiac
and mediastinal contours are within normal limits.  No
pneumothorax, or pleural effusion. No acute osseous findings.]
IMPRESSION: No acute cardiopulmonary disease.

## 2014-03-30 ENCOUNTER — Other Ambulatory Visit (HOSPITAL_COMMUNITY)
Admission: RE | Admit: 2014-03-30 | Discharge: 2014-03-30 | Disposition: A | Discharge status: Home / Self Care | Payer: BC Managed Care – PPO | Source: Ambulatory Visit | Attending: Family Medicine | Admitting: Family Medicine

## 2014-03-30 ENCOUNTER — Other Ambulatory Visit: Payer: Self-pay | Admitting: Family Medicine

## 2014-03-30 DIAGNOSIS — Z113 Encounter for screening for infections with a predominantly sexual mode of transmission: Secondary | ICD-10-CM | POA: Insufficient documentation

## 2014-03-30 DIAGNOSIS — Z01419 Encounter for gynecological examination (general) (routine) without abnormal findings: Secondary | ICD-10-CM | POA: Insufficient documentation

## 2014-03-31 LAB — CYTOLOGY - PAP

## 2016-04-24 ENCOUNTER — Other Ambulatory Visit: Payer: Self-pay | Admitting: Family Medicine

## 2016-04-24 DIAGNOSIS — Z1231 Encounter for screening mammogram for malignant neoplasm of breast: Secondary | ICD-10-CM

## 2016-05-08 ENCOUNTER — Ambulatory Visit: Payer: Self-pay

## 2017-01-03 ENCOUNTER — Ambulatory Visit (INDEPENDENT_AMBULATORY_CARE_PROVIDER_SITE_OTHER): Payer: Self-pay | Admitting: Nurse Practitioner

## 2017-01-03 VITALS — BP 126/80 | HR 107 | Temp 99.1°F | Wt 247.6 lb

## 2017-01-03 DIAGNOSIS — J0100 Acute maxillary sinusitis, unspecified: Secondary | ICD-10-CM

## 2017-01-03 DIAGNOSIS — J209 Acute bronchitis, unspecified: Secondary | ICD-10-CM

## 2017-01-03 MED ORDER — HYDROCODONE-HOMATROPINE 5-1.5 MG/5ML PO SYRP: 5.0000 mL | ORAL_SOLUTION | Freq: Three times a day (TID) | ORAL | 0 refills | Status: AC | PRN

## 2017-01-03 MED ORDER — AMOXICILLIN-POT CLAVULANATE 875-125 MG PO TABS: 1.0000 | ORAL_TABLET | Freq: Two times a day (BID) | ORAL | 0 refills | Status: AC

## 2017-01-03 MED ORDER — FLUTICASONE PROPIONATE 50 MCG/ACT NA SUSP: 2.0000 | Freq: Every day | NASAL | 0 refills | Status: AC

## 2017-01-03 MED ORDER — BENZONATATE 100 MG PO CAPS: 100.0000 mg | ORAL_CAPSULE | Freq: Three times a day (TID) | ORAL | 0 refills | Status: AC | PRN

## 2017-01-03 MED ORDER — PREDNISONE 10 MG (21) PO TBPK: ORAL_TABLET | ORAL | 0 refills | Status: AC

## 2017-01-03 NOTE — Progress Notes (Signed)
Patient ID: Jodi Hunt, female   DOB: [birth year], 51 y.o.   MRN: [id number] Subjective:  Jodi Hunt is a 51 y.o. female who presents for evaluation of URI like symptoms.  Symptoms include congestion, cough described as productive, fever: low grade fevers and chills, night sweats, post nasal drip, sinus pain and sore throat.  Onset of symptoms was 5 days ago, and has been gradually worsening since that time.  Treatment to date:  Mucinex and sinus medication..  High risk factors for influenza complications:  works in child care. .  The following portions of the patient's history were reviewed and updated as appropriate:  allergies, current medications and past medical history.  Constitutional: positive for chills, fatigue, fevers and sweats, negative for anorexia Eyes: negative Ears, nose, mouth, throat, and face: positive for earaches, nasal congestion and sore throat, negative for ear drainage Respiratory: positive for cough, wheezing and history of bronchitis, negative for asthma Cardiovascular: negative Allergic/Immunologic: positive for hay fever Objective:  BP 126/80 (BP Location: Right Arm, Patient Position: Sitting, Cuff Size: Large)   Pulse (!) 107   Temp 99.1 F (37.3 C) (Oral)   Wt 247 lb 9.6 oz (112.3 kg)  General appearance: alert, cooperative, fatigued, moderate distress and due to cough Head: Normocephalic, without obvious abnormality, atraumatic Eyes: conjunctivae/corneas clear. PERRL, EOM's intact. Fundi benign. Ears: normal TM's and external ear canals both ears and TM without erythema or bulging, fluid-filled bilaterally Nose: clear discharge, mild congestion, moderate maxillary sinus tenderness bilateral, mild frontal sinus tenderness bilateral Throat: lips, mucosa, and tongue normal; teeth and gums normal Lungs: clear to auscultation bilaterally and uncontrollable coughing during exam Heart: regular rate and rhythm, S1, S2 normal, no murmur, click, rub or  gallop Neurologic: Alert and oriented X 3, normal strength and tone. Normal symmetric reflexes. Normal coordination and gait    Assessment:  sinusitis and bronchitis    Plan:  Discussed diagnosis and treatment of URI. Discussed diagnosis and treatment of sinusitis. Supportive care with appropriate antipyretics and fluids. Nasal steroids per orders. Tylenol or Ibuprofen for fever as directed.  Increase fluids and rest.  Reviewed indications for patient to go to ER, difficulty breathing, increased SOB or worsening cough.  Follow up in 2-3 days if no improvement.

## 2017-01-03 NOTE — Patient Instructions (Addendum)
Sinusitis, Adult Sinusitis is soreness and inflammation of your sinuses. Sinuses are hollow spaces in the bones around your face. Your sinuses are located:  Around your eyes.  In the middle of your forehead.  Behind your nose.  In your cheekbones. Your sinuses and nasal passages are lined with a stringy fluid (mucus). Mucus normally drains out of your sinuses. When your nasal tissues become inflamed or swollen, the mucus can become trapped or blocked so air cannot flow through your sinuses. This allows bacteria, viruses, and funguses to grow, which leads to infection. Sinusitis can develop quickly and last for 7?10 days (acute) or for more than 12 weeks (chronic). Sinusitis often develops after a cold. What are the causes? This condition is caused by anything that creates swelling in the sinuses or stops mucus from draining, including:  Allergies.  Asthma.  Bacterial or viral infection.  Abnormally shaped bones between the nasal passages.  Nasal growths that contain mucus (nasal polyps).  Narrow sinus openings.  Pollutants, such as chemicals or irritants in the air.  A foreign object stuck in the nose.  A fungal infection. This is rare. What increases the risk? The following factors may make you more likely to develop this condition:  Having allergies or asthma.  Having had a recent cold or respiratory tract infection.  Having structural deformities or blockages in your nose or sinuses.  Having a weak immune system.  Doing a lot of swimming or diving.  Overusing nasal sprays.  Smoking. What are the signs or symptoms? The main symptoms of this condition are pain and a feeling of pressure around the affected sinuses. Other symptoms include:  Upper toothache.  Earache.  Headache.  Bad breath.  Decreased sense of smell and taste.  A cough that may get worse at night.  Fatigue.  Fever.  Thick drainage from your nose. The drainage is often green and it may  contain pus (purulent).  Stuffy nose or congestion.  Postnasal drip. This is when extra mucus collects in the throat or back of the nose.  Swelling and warmth over the affected sinuses.  Sore throat.  Sensitivity to light. How is this diagnosed? This condition is diagnosed based on symptoms, a medical history, and a physical exam. To find out if your condition is acute or chronic, your health care provider may:  Look in your nose for signs of nasal polyps.  Tap over the affected sinus to check for signs of infection.  View the inside of your sinuses using an imaging device that has a light attached (endoscope). If your health care provider suspects that you have chronic sinusitis, you may also:  Be tested for allergies.  Have a sample of mucus taken from your nose (nasal culture) and checked for bacteria.  Have a mucus sample examined to see if your sinusitis is related to an allergy. If your sinusitis does not respond to treatment and it lasts longer than 8 weeks, you may have an MRI or CT scan to check your sinuses. These scans also help to determine how severe your infection is. In rare cases, a bone biopsy may be done to rule out more serious types of fungal sinus disease. How is this treated? Treatment for sinusitis depends on the cause and whether your condition is chronic or acute. If a virus is causing your sinusitis, your symptoms will go away on their own within 10 days. You may be given medicines to relieve your symptoms, including:  Topical nasal decongestants. They   shrink swollen nasal passages and let mucus drain from your sinuses.  Antihistamines. These drugs block inflammation that is triggered by allergies. This can help to ease swelling in your nose and sinuses.  Topical nasal corticosteroids. These are nasal sprays that ease inflammation and swelling in your nose and sinuses.  Nasal saline washes. These rinses can help to get rid of thick mucus in your  nose. If your condition is caused by bacteria, you will be given an antibiotic medicine. If your condition is caused by a fungus, you will be given an antifungal medicine. Surgery may be needed to correct underlying conditions, such as narrow nasal passages. Surgery may also be needed to remove polyps. Follow these instructions at home: Medicines  Take, use, or apply over-the-counter and prescription medicines only as told by your health care provider. These may include nasal sprays.  If you were prescribed an antibiotic medicine, take it as told by your health care provider. Do not stop taking the antibiotic even if you start to feel better. Hydrate and Humidify  Drink enough water to keep your urine clear or pale yellow. Staying hydrated will help to thin your mucus.  Use a cool mist humidifier to keep the humidity level in your home above 50%.  Inhale steam for 10-15 minutes, 3-4 times a day or as told by your health care provider. You can do this in the bathroom while a hot shower is running.  Limit your exposure to cool or dry air. Rest  Rest as much as possible.  Sleep with your head raised (elevated).  Make sure to get enough sleep each night. General instructions  Apply a warm, moist washcloth to your face 3-4 times a day or as told by your health care provider. This will help with discomfort.  Wash your hands often with soap and water to reduce your exposure to viruses and other germs. If soap and water are not available, use hand sanitizer.  Do not smoke. Avoid being around people who are smoking (secondhand smoke).  Keep all follow-up visits as told by your health care provider. This is important. Contact a health care provider if:  You have a fever.  Your symptoms get worse.  Your symptoms do not improve within 10 days. Get help right away if:  You have a severe headache.  You have persistent vomiting.  You have pain or swelling around your face or  eyes.  You have vision problems.  You develop confusion.  Your neck is stiff.  You have trouble breathing. This information is not intended to replace advice given to you by your health care provider. Make sure you discuss any questions you have with your health care provider. Document Released: 10/14/2005 Document Revised: 06/09/2016 Document Reviewed: 08/09/2015 Elsevier Interactive Patient Education  2017 [employer] Acute Bronchitis, Adult Acute bronchitis is sudden (acute) swelling of the air tubes (bronchi) in the lungs. Acute bronchitis causes these tubes to fill with mucus, which can make it hard to breathe. It can also cause coughing or wheezing. In adults, acute bronchitis usually goes away within 2 weeks. A cough caused by bronchitis may last up to 3 weeks. Smoking, allergies, and asthma can make the condition worse. Repeated episodes of bronchitis may cause further lung problems, such as chronic obstructive pulmonary disease (COPD). What are the causes? This condition can be caused by germs and by substances that irritate the lungs, including:  Cold and flu viruses. This condition is most often caused by the same   virus that causes a cold.  Bacteria.  Exposure to tobacco smoke, dust, fumes, and air pollution. What increases the risk? This condition is more likely to develop in people who:  Have close contact with someone with acute bronchitis.  Are exposed to lung irritants, such as tobacco smoke, dust, fumes, and vapors.  Have a weak immune system.  Have a respiratory condition such as asthma. What are the signs or symptoms? Symptoms of this condition include:  A cough.  Coughing up clear, yellow, or green mucus.  Wheezing.  Chest congestion.  Shortness of breath.  A fever.  Body aches.  Chills.  A sore throat. How is this diagnosed? This condition is usually diagnosed with a physical exam. During the exam, your health care provider may order  tests, such as chest X-rays, to rule out other conditions. He or she may also:  Test a sample of your mucus for bacterial infection.  Check the level of oxygen in your blood. This is done to check for pneumonia.  Do a chest X-ray or lung function testing to rule out pneumonia and other conditions.  Perform blood tests. Your health care provider will also ask about your symptoms and medical history. How is this treated? Most cases of acute bronchitis clear up over time without treatment. Your health care provider may recommend:  Drinking more fluids. Drinking more makes your mucus thinner, which may make it easier to breathe.  Taking a medicine for a fever or cough.  Taking an antibiotic medicine.  Using an inhaler to help improve shortness of breath and to control a cough.  Using a cool mist vaporizer or humidifier to make it easier to breathe. Follow these instructions at home: Medicines  Take over-the-counter and prescription medicines only as told by your health care provider.  If you were prescribed an antibiotic, take it as told by your health care provider. Do not stop taking the antibiotic even if you start to feel better. General instructions  Get plenty of rest.  Drink enough fluids to keep your urine clear or pale yellow.  Avoid smoking and secondhand smoke. Exposure to cigarette smoke or irritating chemicals will make bronchitis worse. If you smoke and you need help quitting, ask your health care provider. Quitting smoking will help your lungs heal faster.  Use an inhaler, cool mist vaporizer, or humidifier as told by your health care provider.  Keep all follow-up visits as told by your health care provider. This is important. How is this prevented? To lower your risk of getting this condition again:  Wash your hands often with soap and water. If soap and water are not available, use hand sanitizer.  Avoid contact with people who have cold symptoms.  Try not  to touch your hands to your mouth, nose, or eyes.  Make sure to get the flu shot every year. Contact a health care provider if:  Your symptoms do not improve in 2 weeks of treatment. Get help right away if:  You cough up blood.  You have chest pain.  You have severe shortness of breath.  You become dehydrated.  You faint or keep feeling like you are going to faint.  You keep vomiting.  You have a severe headache.  Your fever or chills gets worse. This information is not intended to replace advice given to you by your health care provider. Make sure you discuss any questions you have with your health care provider. Document Released: 11/21/2004 Document Revised: 05/08/2016 Document Reviewed:   04/03/2016 Elsevier Interactive Patient Education  2017 [employer] Inc[employer]  

## 2017-01-07 ENCOUNTER — Telehealth: Payer: Self-pay | Admitting: Nurse Practitioner

## 2017-01-07 NOTE — Telephone Encounter (Signed)
Called patient to follow up.  Number in chart is no longer a working number.

## 2018-02-12 ENCOUNTER — Other Ambulatory Visit: Payer: Self-pay | Admitting: Family Medicine

## 2018-02-12 DIAGNOSIS — Z1231 Encounter for screening mammogram for malignant neoplasm of breast: Secondary | ICD-10-CM

## 2019-11-03 ENCOUNTER — Other Ambulatory Visit: Payer: Self-pay

## 2019-11-03 DIAGNOSIS — Z20822 Contact with and (suspected) exposure to covid-19: Secondary | ICD-10-CM

## 2019-11-04 LAB — NOVEL CORONAVIRUS, NAA: SARS-CoV-2, NAA: NOT DETECTED

## 2022-06-05 ENCOUNTER — Encounter (HOSPITAL_BASED_OUTPATIENT_CLINIC_OR_DEPARTMENT_OTHER): Payer: Self-pay

## 2022-06-05 ENCOUNTER — Other Ambulatory Visit: Payer: Self-pay

## 2022-06-05 ENCOUNTER — Emergency Department (HOSPITAL_BASED_OUTPATIENT_CLINIC_OR_DEPARTMENT_OTHER): Payer: Self-pay

## 2022-06-05 ENCOUNTER — Emergency Department (HOSPITAL_BASED_OUTPATIENT_CLINIC_OR_DEPARTMENT_OTHER)
Admission: EM | Admit: 2022-06-05 | Discharge: 2022-06-05 | Disposition: A | Discharge status: Home / Self Care | Payer: Worker's Compensation | Attending: Emergency Medicine | Admitting: Emergency Medicine

## 2022-06-05 DIAGNOSIS — Y92210 Daycare center as the place of occurrence of the external cause: Secondary | ICD-10-CM | POA: Insufficient documentation

## 2022-06-05 DIAGNOSIS — W19XXXA Unspecified fall, initial encounter: Secondary | ICD-10-CM | POA: Diagnosis not present

## 2022-06-05 DIAGNOSIS — J45909 Unspecified asthma, uncomplicated: Secondary | ICD-10-CM | POA: Diagnosis not present

## 2022-06-05 DIAGNOSIS — I10 Essential (primary) hypertension: Secondary | ICD-10-CM | POA: Diagnosis not present

## 2022-06-05 DIAGNOSIS — Y990 Civilian activity done for income or pay: Secondary | ICD-10-CM | POA: Diagnosis not present

## 2022-06-05 DIAGNOSIS — M25561 Pain in right knee: Secondary | ICD-10-CM | POA: Insufficient documentation

## 2022-06-05 MED ORDER — LIDOCAINE 5 % EX PTCH
1.0000 | MEDICATED_PATCH | CUTANEOUS | Status: DC
  Administered 2022-06-05: 1 via TRANSDERMAL
  Filled 2022-06-05: qty 1

## 2022-06-05 NOTE — ED Provider Notes (Signed)
[facility] EMERGENCY DEPARTMENT Provider Note   CSN: [id number] Arrival date & time: 06/05/22  0841     History  Chief Complaint  Patient presents with   Knee Pain    Jodi Hunt is a 56 y.o. female.  HPI 57 year old female history of hypertension and asthma presents to the ER with complaints of right knee pain.  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  She landed onto her right knee.  She initially was having pain with weightbearing but has progressively able to bear more weight as time is passed.  She has taken ibuprofen for pain.  Denies any numbness or tingling.  Denies any head injury or LOC.    Home Medications Prior to Admission medications   Medication Sig Start Date End Date Taking? Authorizing Provider  benzonatate (TESSALON) 100 MG capsule Take 1 capsule (100 mg total) by mouth every 8 (eight) hours. Patient not taking: Reported on 01/03/2017 12/14/12   Jaynie Crumble, PA-C  fluticasone (FLONASE) 50 MCG/ACT nasal spray Place 2 sprays into both nostrils daily. 01/03/17 01/13/17  Leath-Warren, Sadie Haber, NP      Allergies    Patient has no known allergies.    Review of Systems   Review of Systems Ten systems reviewed and are negative for acute change, except as noted in the HPI.   Physical Exam Updated Vital Signs BP (!) 129/51 (BP Location: Right Arm)   Pulse 78   Temp 98.7 F (37.1 C) (Oral)   Resp 18   Ht 5\' 5"  (1.651 m)   Wt 102.1 kg   SpO2 95%   BMI 37.44 kg/m  Physical Exam Vitals and nursing note reviewed.  Constitutional:      General: She is not in acute distress.    Appearance: She is well-developed.  HENT:     Head: Normocephalic and atraumatic.  Eyes:     Conjunctiva/sclera: Conjunctivae normal.  Cardiovascular:     Rate and Rhythm: Normal rate and regular rhythm.     Heart sounds: No murmur heard. Pulmonary:     Effort: Pulmonary effort is normal. No  respiratory distress.     Breath sounds: Normal breath sounds.  Abdominal:     Palpations: Abdomen is soft.     Tenderness: There is no abdominal tenderness.  Musculoskeletal:        General: Swelling and tenderness present. No deformity.     Cervical back: Neck supple.     Comments: No tibial plateau tenderness, generalized tenderness to the lateral aspects of the knee  Skin:    General: Skin is warm and dry.     Capillary Refill: Capillary refill takes less than 2 seconds.  Neurological:     Mental Status: She is alert.  Psychiatric:        Mood and Affect: Mood normal.     ED Results / Procedures / Treatments   Labs (all labs ordered are listed, but only abnormal results are displayed) Labs Reviewed - No data to display  EKG None  Radiology DG Knee Complete 4 Views Right  Result Date: 06/05/2022 CLINICAL DATA:  Right knee pain after fall. EXAM: RIGHT KNEE - COMPLETE 4+ VIEW COMPARISON:  July 21, 2009. FINDINGS: No evidence of fracture, dislocation, or joint effusion. No evidence of arthropathy or other focal bone abnormality. Soft tissues are unremarkable. IMPRESSION: Negative. Electronically Signed   By: July 23, 2009.D.  On: 06/05/2022 09:41    Procedures Procedures    Medications Ordered in ED Medications  lidocaine (LIDODERM) 5 % 1 patch (1 patch Transdermal Patch Applied 06/05/22 0915)    ED Course/ Medical Decision Making/ A&P                           Medical Decision Making Amount and/or Complexity of Data Reviewed Radiology: ordered.  Risk Prescription drug management.   56 year old female presenting with right knee pain after fall.  Her x-rays are negative for fracture.  No head injury. She has no tibial plateau tenderness and my concern for tibial plateau fracture is low.  No evidence of septic joint or gout.  Per discussion with the patient, will provide a knee sleeve.  Encouraged RICE.  Encouraged her to follow-up with her PCP or orthopedics  if her pain persist.  She was understanding and is agreeable.  We discussed return precautions.  Stable for discharge. Final Clinical Impression(s) / ED Diagnoses Final diagnoses:  Fall, initial encounter  Acute pain of right knee    Rx / DC Orders ED Discharge Orders     None         Mare Ferrari, PA-C 06/05/22 1610    Pricilla Loveless, MD 06/06/22 [phone]

## 2022-06-05 NOTE — ED Notes (Signed)
Discharge instructions reviewed with patient. Patient verbalizes understanding, no further questions at this time. Medications and follow up information provided. No acute distress noted at time of departure.  

## 2022-06-05 NOTE — Discharge Instructions (Signed)
You were evaluated in the Emergency Department and after careful evaluation, we did not find any emergent condition requiring admission or further testing in the hospital.  Rest - please stay off ankle as much as possible Ice - ice for 20 minutes at a time, several times a day Compression - wear brace to provide support Elevate - elevate ankle above level of heart Ibuprofen - take with food. Take up to 3-4 times daily   Please return to the Emergency Department if you experience any worsening of your condition.  We encourage you to follow up with a primary care provider.  Thank you for allowing [place] to be a part of your care.

## 2022-06-05 NOTE — ED Triage Notes (Signed)
Pt states she fell at work, complain of knee pain. Denies LOC and headache.

## 2022-06-17 ENCOUNTER — Encounter: Payer: Self-pay | Admitting: Sports Medicine

## 2022-06-17 ENCOUNTER — Ambulatory Visit (INDEPENDENT_AMBULATORY_CARE_PROVIDER_SITE_OTHER): Payer: Worker's Compensation | Admitting: Sports Medicine

## 2022-06-17 ENCOUNTER — Ambulatory Visit: Payer: Self-pay

## 2022-06-17 DIAGNOSIS — W19XXXA Unspecified fall, initial encounter: Secondary | ICD-10-CM | POA: Diagnosis not present

## 2022-06-17 DIAGNOSIS — S8001XA Contusion of right knee, initial encounter: Secondary | ICD-10-CM | POA: Diagnosis not present

## 2022-06-17 DIAGNOSIS — M25561 Pain in right knee: Secondary | ICD-10-CM | POA: Diagnosis not present

## 2022-06-17 NOTE — Progress Notes (Signed)
Office Visit Note   Patient: Jodi Hunt           Date of Birth: 04/09/1966           MRN: [id number] Visit Date: 06/17/2022              Requested by: No referring provider defined for this encounter. PCP: Patient, No Pcp Per   Assessment & Plan: Visit Diagnoses:  1. Right knee pain, unspecified chronicity   2. Contusion of right knee, initial encounter   3. Fall, initial encounter     Plan: Reviewed patient previous x-rays and discussed with patient based on her mechanism, her injury seems indicative of a contusion of the right knee with likely underlying prepatellar aseptic bursitis.  Ligamentously intact although some medial joint line TTP.  I do think this has a high likelihood of improving with time and formalized physical therapy, did send a referral today. Would expect MMI from about 3-6 weeks from initial injury, however subject to change depending on progression and reevaluation. Given the trauma to the knee, I do feel it is best that she perform seated work only at this time, avoiding any squatting, bending, or kneeling on her knees.  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   Orders:  Orders Placed This Encounter  Procedures   [place] Extrem Low Right Ltd   Ambulatory referral to Physical Therapy   No orders of the defined types were placed in this encounter.   Clinical Data: No additional findings.   Subjective: Chief Complaint  Patient presents with   Right Knee - Pain    HPI - Jodi Hunt is a pleasant 56 year old female who presents with right knee pain after fall about 2 weeks ago.  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  She was seen in the emergency department on 06/05/2022 and had x-rays which were negative for any acute fracture.  She did initially have difficulty weightbearing, although has been able  to ambulate more recently.  Her pain is about 45-50% improved from her initial fall but still has some pain with walking and with bending the knee.  Initially was swollen, although this has improved.  She takes ibuprofen only as needed.  Has been told she has arthritis of the knee, but no gross injuries in the past.  Review of Systems + right knee pain - no LOC, HA   Objective: Vital Signs:  - HR: 79 -RR: 12  Physical Exam Gen: Well-appearing, in no acute distress; non-toxic CV: Regular Rate. Well-perfused. Warm.  Resp: Breathing unlabored on room air; no wheezing. Psych: Fluid speech in conversation; appropriate affect; normal thought process Neuro: Sensation intact throughout. No gross coordination deficits.    Ortho Exam - Right knee: Examination of the right knee demonstrates no significant erythema, ecchymosis or effusion.  There is notable tenderness to palpation throughout the patella with some localized soft tissue swelling. There is also some medial joint line TTP.  Range of motion of the knee from 0-125 degrees intact, there is some pain with endrange extension.  Negative varus or valgus instability.  Able to walk, but mildly antalgic gait.  Negative Lachman, anterior/posterior drawer. NVI.  Imaging: [place] Extrem Low Right Ltd  Result Date: 06/17/2022 [facility] knee ultrasound performed, right Diep and short axis view of patella  evaluated with mild cortical irregularity of superior patella, likely indicative of enthesiophytic change. There is a small degree of fluid/hyperemia superior to patella of peripatellar bursa.  Suprapatellar pouch visualized in [place] and short axis with no joint effusion. Quadriceps tendon visualized in both Gebhart and short axis without abnormality. Patellar Tendon is visualized in [place]iz and short axis without abnormality. Medial joint line with insufficient view of meniscus, but mild hyperemia associated by medial joint line.    Small degree of  fluid/hyperemia suggestive of patellar contusion or aseptic prepatellar bursitis.       PMFS History: There are no problems to display for this patient.  Past Medical History:  Diagnosis Date   Arthritis    Asthma    Hypertension     Family History  Problem Relation Age of Onset   Hyperlipidemia Mother    Hypertension Mother    Cancer Father    Diabetes Brother    Hypertension Brother     Past Surgical History:  Procedure Laterality Date   THYROIDECTOMY  86   Social History   Occupational History   Not on file  Tobacco Use   Smoking status: Never   Smokeless tobacco: Never  Substance and Sexual Activity   Alcohol use: No   Drug use: No   Sexual activity: Not on file   Madelyn Brunner, DO Sports Medicine Physician - Orthopedics [facility]   This note was dictated using Dragon naturally speaking software and may contain errors in syntax, spelling, or content which have not been identified prior to signing this note.

## 2022-06-28 ENCOUNTER — Ambulatory Visit (INDEPENDENT_AMBULATORY_CARE_PROVIDER_SITE_OTHER): Payer: Worker's Compensation | Admitting: Physical Therapy

## 2022-06-28 ENCOUNTER — Other Ambulatory Visit: Payer: Self-pay

## 2022-06-28 ENCOUNTER — Encounter: Payer: Self-pay | Admitting: Physical Therapy

## 2022-06-28 DIAGNOSIS — M25661 Stiffness of right knee, not elsewhere classified: Secondary | ICD-10-CM | POA: Diagnosis not present

## 2022-06-28 DIAGNOSIS — M6281 Muscle weakness (generalized): Secondary | ICD-10-CM

## 2022-06-28 DIAGNOSIS — R600 Localized edema: Secondary | ICD-10-CM

## 2022-06-28 DIAGNOSIS — M25561 Pain in right knee: Secondary | ICD-10-CM

## 2022-06-28 DIAGNOSIS — R2689 Other abnormalities of gait and mobility: Secondary | ICD-10-CM

## 2022-06-28 NOTE — Therapy (Signed)
OUTPATIENT PHYSICAL THERAPY LOWER EXTREMITY EVALUATION   Patient Name: Jodi Hunt MRN: [id number] DOB:1966-08-05, 56 y.o., female Today's Date: 06/28/2022   PT End of Session - 06/28/22 0932     Visit Number 1    Number of Visits 12    Date for PT Re-Evaluation 09/20/22    Authorization Type WC approved 12 visits    PT Start Time 0845    PT Stop Time 0919    PT Time Calculation (min) 34 min    Activity Tolerance Patient tolerated treatment well    Behavior During Therapy [facility] for tasks assessed/performed             Past Medical History:  Diagnosis Date   Arthritis    Asthma    Hypertension    Past Surgical History:  Procedure Laterality Date   THYROIDECTOMY  1991   There are no problems to display for this patient.   PCP: Verlon Au, MD    REFERRING PROVIDER: Madelyn Brunner, DO   REFERRING DIAG: [phone] (ICD-10-CM) - Right knee pain, unspecified chronicity   THERAPY DIAG:  Acute pain of right knee - Plan: PT plan of care cert/re-cert  Stiffness of right knee, not elsewhere classified - Plan: PT plan of care cert/re-cert  Muscle weakness (generalized) - Plan: PT plan of care cert/re-cert  Other abnormalities of gait and mobility - Plan: PT plan of care cert/re-cert  Localized edema - Plan: PT plan of care cert/re-cert  Rationale for Evaluation and Treatment Rehabilitation  ONSET DATE: 06/05/22  SUBJECTIVE:   SUBJECTIVE STATEMENT: Pt is a 56 y/o female who presents to OPPT s/p a fall on 06/05/22, when she tripped over a small child.  She has been out of work until this week and has returned for 4 hours at a time.  Restrictions at this time are no kneeling and bending.  PERTINENT HISTORY: hypertension and asthma   PAIN:  Are you having pain? Yes: NPRS scale: 8 currently, up to 10, at best 5/10 Pain location: Rt knee, medial to lateral Pain description: sharp, shooting, aching, sore Aggravating factors: stairs, prolonged sitting (20-30  min), driving Relieving factors: ankle pumps, repositioning (getting up to walk), celebrex  PRECAUTIONS: None  WEIGHT BEARING RESTRICTIONS No  FALLS:  Has patient fallen in last 6 months? 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)  PLOF: Independent and Leisure: cooking, baking; no regular exercise  PATIENT GOALS improve pain and mobility   OBJECTIVE:   DIAGNOSTIC FINDINGS: U/S: Small degree of fluid/hyperemia suggestive of patellar contusion or aseptic prepatellar bursitis   PATIENT SURVEYS:  06/28/22: FOTO 26 (predicted 45)  COGNITION: Overall cognitive status: Within functional limits for tasks assessed     SENSATION: WFL  EDEMA:  06/28/22: small pocket of fluid noted suprapatellar space, diffuse edema Rt knee but not formally measured  POSTURE: No Significant postural limitations  PALPATION: 06/28/22: tenderness to light touch with patella mobility, medial joint line tenderness, pes anserine tenderness  LOWER EXTREMITY ROM:  Active ROM Right eval Left eval  Knee flexion 95 118  Knee extension -2 3   (Blank rows = not tested)  Passive ROM Right eval Left eval  Knee flexion 120   Knee extension 2    (  Blank rows = not tested)  LOWER EXTREMITY MMT:  MMT Right eval Left eval  Knee flexion 3/5  4/5  Knee extension 3/5 (9.1#) 4/5 (24.97#)   (Blank rows = not tested)  GAIT: 06/28/22 Distance walked: 100' Assistive device utilized:  had brace on Rt knee Level of assistance: Modified independence Comments: antalgic gait on Rt, decreased stance Rt, decr hip/knee flexion on Rt with circumduction    TODAY'S TREATMENT: 06/28/22 See HEP - performed trial reps with min cues needed  for technique   PATIENT EDUCATION:  Education details: HEP Person educated: Patient Education method: Explanation, Demonstration, and Handouts Education comprehension: verbalized understanding, returned demonstration, and needs further education   HOME EXERCISE PROGRAM: Access Code: [email or web address] URL: [email or web address] Date: 06/28/2022 Prepared by: Moshe Cipro  Exercises - Pavlicek Sitting Quad Set  - 2-3 x daily - 7 x weekly - 1 sets - 10 reps - 5 sec hold - Supine Heel Slide with Strap  - 2-3 x daily - 7 x weekly - 1 sets - 10 reps - 2-3 sec hold - Seated Knee Extension AROM  - 2-3 x daily - 7 x weekly - 1 sets - 10 reps - 5 sec hold hold  ASSESSMENT:  CLINICAL IMPRESSION: Patient is a 56 y.o. female who was seen today for physical therapy evaluation and treatment for Rt knee pain.  She demonstrates decreased strength and ROM, residual swelling and gait abnormalities affecting functional mobility.  She will benefit from PT to address deficits listed.    OBJECTIVE IMPAIRMENTS Abnormal gait, decreased mobility, difficulty walking, decreased ROM, decreased strength, increased edema, and pain.   ACTIVITY LIMITATIONS carrying, lifting, bending, sitting, standing, squatting, stairs, transfers, bed mobility, locomotion level, and caring for others  PARTICIPATION LIMITATIONS: meal prep, cleaning, laundry, driving, shopping, community activity, and occupation  PERSONAL FACTORS 1-2 comorbidities: HTN, asthma  are also affecting patient's functional outcome.   REHAB POTENTIAL: Good  CLINICAL DECISION MAKING: Stable/uncomplicated  EVALUATION COMPLEXITY: Low   GOALS: Goals reviewed with patient? Yes  SHORT TERM GOALS: Target date: 07/26/2022  Independent with initial HEP Goal status: INITIAL  2.  Rt knee AROM improved 0-110 for improved mobility Goal status: INITIAL   [place] TERM GOALS: Target date: 09/20/2022  Independent with final HEP Goal status:  INITIAL  2.  FOTO score improved to 45 Goal status: INITIAL  3.  Rt knee AROM improved to 0-120 for improved function Goal status: INIITAL  4.  Report pain < 2/10 with work simulated activities for improved function Goal status: INITIAL  5.  Demonstrate at least Rt knee ext strength of 25# in order to perform work related activities Goal status: INITIAL     PLAN: PT FREQUENCY: 1-2x/week (12 visits approved)  PT DURATION: 12 weeks  PLANNED INTERVENTIONS: Therapeutic exercises, Therapeutic activity, Neuromuscular re-education, Balance training, Gait training, Patient/Family education, Self Care, Joint mobilization, Stair training, Aquatic Therapy, Dry Needling, Electrical stimulation, Cryotherapy, Moist heat, Taping, Vasopneumatic device, Ultrasound, Ionotophoresis 4mg /ml Dexamethasone, Manual therapy, and Re-evaluation  PLAN FOR NEXT SESSION: review HEP, continue with quad/hamstring strengthening, pain management strategies PRN   , PT, DPT 06/28/22 9:51 AM

## 2022-07-08 ENCOUNTER — Ambulatory Visit: Payer: Self-pay

## 2022-07-08 ENCOUNTER — Encounter: Payer: Self-pay | Admitting: Sports Medicine

## 2022-07-08 ENCOUNTER — Ambulatory Visit (INDEPENDENT_AMBULATORY_CARE_PROVIDER_SITE_OTHER): Payer: Worker's Compensation | Admitting: Sports Medicine

## 2022-07-08 DIAGNOSIS — M25561 Pain in right knee: Secondary | ICD-10-CM | POA: Diagnosis not present

## 2022-07-08 DIAGNOSIS — S8002XD Contusion of left knee, subsequent encounter: Secondary | ICD-10-CM | POA: Diagnosis not present

## 2022-07-08 DIAGNOSIS — M7041 Prepatellar bursitis, right knee: Secondary | ICD-10-CM | POA: Diagnosis not present

## 2022-07-08 NOTE — Progress Notes (Signed)
Jodi Hunt - 56 y.o. female MRN [id number]  Date of birth: 03-25-1966  Office Visit Note: Visit Date: 07/08/2022 PCP: Patient, No Pcp Per Referred by: No ref. provider found  Subjective: Chief Complaint  Patient presents with   Right Knee - Pain   HPI: Jodi Hunt is a pleasant 56 y.o. female who presents today for follow-up of her right anterior knee pain.  I originally saw patient on 06/17/2022 and she had a contusion of the right knee as well as a small amount of fluid in the patellar bursa.  She has continued in her knee brace and was sent to formalized physical therapy.  She had her first assessment with PT on 06/28/2022, although her first true session is not until later this week.  She continues to have some pain and swelling over the anterior aspect of the knee.  Some days she has little pain, some days with certain activities her pain will be worse.  She denies any giving out of the knee although does really report some restriction in range of motion.  She has returned to 4 hours, seated work only.  Tolerating this okay.  When she is active on her feet the knee will be slightly more sore.  Pertinent ROS were reviewed with the patient and found to be negative unless otherwise specified above in HPI.   Assessment & Plan: Visit Diagnoses:  1. Prepatellar bursitis, right knee   2. Right knee pain, unspecified chronicity   3. Contusion of left knee, subsequent encounter    Plan: Jodi Hunt is still having some discomfort and restriction in range of motion of the knee.  On my exam and ultrasound today she had a larger fluid accumulation within the knee of the prepatellar bursa.  I feel like this is inhibiting her motion as well as causing pain anytime she tries to bend or apply pressure over the knee.  Through shared decision making, elected to proceed with aspiration and subsequent injection, patient tolerated well.  Discussed that patient may discontinue her [place] knee  brace, recommended however maintaining ACE compression wrap or compression knee sleeve during the day.  She will continue with formalized physical therapy, did recommend twice weekly for the next 3 weeks.  We will keep her on the same work restrictions with limited 4-hour work day with seated work avoiding any squatting bending or kneeling on the knees.  She can ambulate as her pain allows.  I am hopeful that she will improve much quicker now with the aspiration and continued formalized therapy.  I will reevaluate her in 3 weeks, hopefully at this time we will be able to return her to work without restrictions but will wait until that visit for reevaluation.  Expected MMI 6-8 weeks from injury.  She may take Tylenol or NSAID therapy for pain control as needed.  Follow-up: Return in about 3 weeks (around 07/29/2022).   Meds & Orders: No orders of the defined types were placed in this encounter.   Orders Placed This Encounter  Procedures   [place] Extrem Low Right Ltd     Procedures: US-guided Knee (Prepatellar) Aspiration, Right: After discussion on risks/benefits/indications was provided, informed verbal consent was obtained and a timeout was performed, patient was lying supine on exam table. The knee was prepped with alcohol swab.  Utilizing superolateral approach, approximately 3 mL of lidocaine 1% was used for local anesthesia. Then using an 18g needle on 20cc syringe, 10 mL of clear straw-colored fluid was aspirated  from the prepatellar joint of the knee. Utilizing the same portal, the knee joint was then injected with 0.5:0.5cc bupivicaine:betamethasone.  Patient tolerated procedure well without immediate complications. The knee was wrapped with ACE compression wrap.     Clinical History: No specialty comments available.  She reports that she has never smoked. She has never used smokeless tobacco. No results for input(s): "HGBA1C", "LABURIC" in the last 8760 hours.  Objective:   Vital Signs:  -  HR: 84 - RR: 14  Physical Exam  Gen: Well-appearing, in no acute distress; non-toxic CV: Regular Rate. Well-perfused. Warm.  Resp: Breathing unlabored on room air; no wheezing. Psych: Fluid speech in conversation; appropriate affect; normal thought process Neuro: Sensation intact throughout. No gross coordination deficits.   Ortho Exam -Examination of the right knee does demonstrate some swelling over top of the patella.  The associated swelling is mobile in nature and is superficial to the kneecap.  I do not feel any joint effusion within the knee.  There is no surrounding redness, warmth.  There is tenderness to palpation over the anterior aspect of the kneecap.  No true joint line tenderness to palpation.  Range of motion from 0-115 degrees.  No varus or valgus instability.  There is some pain with resisted knee extension.  Neurovascular intact distally.  Imaging: [place] Extrem Low Right Ltd  Result Date: 07/08/2022 [facility] Limited knee ultrasound performed, right Suprapatellar pouch visualized in [place] and short axis with no joint effusion or abnormality.  Quadriceps tendon evaluated in short and [place] axis with proper insertion on the patella.  The patella was evaluated in short and [place] axis without evidence of fracture or cortical regularity.  The prepatellar bursa was visualized with associated bursitis of a hypoechoic fluid collection approximately 3.4 x 0.52 cm in nature.  There is hypoechoic fluid collection does demonstrate dynamic fluctuance as well as a small septation through the midportion of the pocket.    Prepatellar bursitis        Past Medical/Family/Surgical/Social History: Medications & Allergies reviewed per EMR, new medications updated. There are no problems to display for this patient.  Past Medical History:  Diagnosis Date   Arthritis    Asthma    Hypertension    Family History  Problem Relation Age of Onset   Hyperlipidemia Mother    Hypertension Mother    Cancer  Father    Diabetes Brother    Hypertension Brother    Past Surgical History:  Procedure Laterality Date   THYROIDECTOMY  65   Social History   Occupational History   Not on file  Tobacco Use   Smoking status: Never   Smokeless tobacco: Never  Substance and Sexual Activity   Alcohol use: No   Drug use: No   Sexual activity: Not on file

## 2022-07-08 NOTE — Progress Notes (Signed)
Follow up on right knee pain; patient states she still is having some pain. She is wearing a knee brace today with some relief. She starts PT on 07/10/22. Currently they have her working 4 hour shifts at work.

## 2022-07-10 ENCOUNTER — Encounter: Payer: Self-pay | Admitting: Physical Therapy

## 2022-07-10 ENCOUNTER — Ambulatory Visit (INDEPENDENT_AMBULATORY_CARE_PROVIDER_SITE_OTHER): Payer: Worker's Compensation | Admitting: Physical Therapy

## 2022-07-10 DIAGNOSIS — R2689 Other abnormalities of gait and mobility: Secondary | ICD-10-CM

## 2022-07-10 DIAGNOSIS — M25561 Pain in right knee: Secondary | ICD-10-CM | POA: Diagnosis not present

## 2022-07-10 DIAGNOSIS — R600 Localized edema: Secondary | ICD-10-CM

## 2022-07-10 DIAGNOSIS — M25661 Stiffness of right knee, not elsewhere classified: Secondary | ICD-10-CM

## 2022-07-10 DIAGNOSIS — M6281 Muscle weakness (generalized): Secondary | ICD-10-CM | POA: Diagnosis not present

## 2022-07-10 NOTE — Therapy (Signed)
OUTPATIENT PHYSICAL THERAPY TREATMENT NOTE   Patient Name: Jodi Hunt MRN: [id number] DOB:04/20/1966, 56 y.o., female Today's Date: 07/10/2022  PCP: none REFERRING PROVIDER: Madelyn Brunner, DO   END OF SESSION:   PT End of Session - 07/10/22 0927     Visit Number 2    Number of Visits 12    Date for PT Re-Evaluation 09/20/22    Authorization Type WC approved 12 visits    PT Start Time 0845    PT Stop Time 0930    PT Time Calculation (min) 45 min    Activity Tolerance Patient tolerated treatment well    Behavior During Therapy WFL for tasks assessed/performed             Past Medical History:  Diagnosis Date   Arthritis    Asthma    Hypertension    Past Surgical History:  Procedure Laterality Date   THYROIDECTOMY  1991   There are no problems to display for this patient.   REFERRING DIAG:  M25.561 (ICD-10-CM) - Right knee pain, unspecified chronicity  THERAPY DIAG:  Acute pain of right knee  Stiffness of right knee, not elsewhere classified  Muscle weakness (generalized)  Other abnormalities of gait and mobility  Localized edema  Rationale for Evaluation and Treatment Rehabilitation  PERTINENT HISTORY: hypertension and asthma   PRECAUTIONS: none  SUBJECTIVE: Pt stating she had fluid drawn off this week and her knee has felf much better.   PAIN:  Are you having pain? Yes: NPRS scale: 5/10 Pain location: anterior Rt knee Pain description: achy Aggravating factors: walking, bending Relieving factors: resting, having it drained has seemed to help   OBJECTIVE: (objective measures completed at initial evaluation unless otherwise dated)  DIAGNOSTIC FINDINGS: U/S: Small degree of fluid/hyperemia suggestive of patellar contusion or aseptic prepatellar bursitis    PATIENT SURVEYS:  06/28/22: FOTO 26 (predicted 45)   COGNITION: Overall cognitive status: Within functional limits for tasks assessed                          SENSATION: WFL    EDEMA:  06/28/22: small pocket of fluid noted suprapatellar space, diffuse edema Rt knee but not formally measured   POSTURE: No Significant postural limitations   PALPATION: 06/28/22: tenderness to light touch with patella mobility, medial joint line tenderness, pes anserine tenderness   LOWER EXTREMITY ROM:   Active ROM Right eval Left eval Rt 07/10/22  Knee flexion 95 118 120  Knee extension -2 3    (Blank rows = not tested)   Passive ROM Right eval Rt 07/10/22  Knee flexion 120 122  Knee extension 2 2   (Blank rows = not tested)   LOWER EXTREMITY MMT:   MMT Right eval Left eval  Knee flexion 3/5  4/5  Knee extension 3/5 (9.1#) 4/5 (24.97#)   (Blank rows = not tested)   GAIT: 06/28/22 Distance walked: 100' Assistive device utilized:  had brace on Rt knee Level of assistance: Modified independence Comments: antalgic gait on Rt, decreased stance Rt, decr hip/knee flexion on Rt with circumduction       TODAY'S TREATMENT: 07/10/22:  TherEx:  Nustep : 6 minutes Level 4 Slant board: x 2 holding 30 seconds Step ups 4 inch step x 15 each LE LAQ:  2 x 10 each LE holding 3 sec Leg Press: 50# bil LE's 3 x 10 Quad sets: towel under ankle x 15 holding 5 seconds SLR: 2 x  10 extensor lag noted at end of second set Bridges: 2 x 10 Manual Patella mobs Seated knee flexion with IR and distraction Modalities: Vasopneumatic: medium compression 34 degres x 10 minutes   06/28/22 See HEP - performed trial reps with min cues needed for technique     PATIENT EDUCATION:  Education details: HEP Person educated: Patient Education method: Explanation, Demonstration, and Handouts Education comprehension: verbalized understanding, returned demonstration, and needs further education     HOME EXERCISE PROGRAM: Access Code: [email or web address] URL: [email or web address] Date: 06/28/2022 Prepared by: Moshe Cipro   Exercises - [place] Sitting Quad Set  - 2-3 x daily - 7 x  weekly - 1 sets - 10 reps - 5 sec hold - Supine Heel Slide with Strap  - 2-3 x daily - 7 x weekly - 1 sets - 10 reps - 2-3 sec hold - Seated Knee Extension AROM  - 2-3 x daily - 7 x weekly - 1 sets - 10 reps - 5 sec hold hold   ASSESSMENT:   CLINICAL IMPRESSION: Pt tolerating exercises well with no increased pain reported. HEP reviewed with demonstration of compliance. Pt stating since the MD drained the fluid from her knee it has been doing much better. We discussed not wearing her ace bandage due to increased swelling in her lower leg and ankle region. Pt encouraged to continue to ice and elevate as needed. Continue skilled PT to maximize function.       OBJECTIVE IMPAIRMENTS Abnormal gait, decreased mobility, difficulty walking, decreased ROM, decreased strength, increased edema, and pain.    ACTIVITY LIMITATIONS carrying, lifting, bending, sitting, standing, squatting, stairs, transfers, bed mobility, locomotion level, and caring for others   PARTICIPATION LIMITATIONS: meal prep, cleaning, laundry, driving, shopping, community activity, and occupation   PERSONAL FACTORS 1-2 comorbidities: HTN, asthma  are also affecting patient's functional outcome.    REHAB POTENTIAL: Good   CLINICAL DECISION MAKING: Stable/uncomplicated   EVALUATION COMPLEXITY: Low     GOALS: Goals reviewed with patient? Yes   SHORT TERM GOALS: Target date: 07/26/2022   Independent with initial HEP Goal status: on-going 07/10/22   2.  Rt knee AROM improved 0-110 for improved mobility Goal status: INITIAL     [place] TERM GOALS: Target date: 09/20/2022   Independent with final HEP Goal status: INITIAL   2.  FOTO score improved to 45 Goal status: INITIAL   3.  Rt knee AROM improved to 0-120 for improved function Goal status: INIITAL   4.  Report pain < 2/10 with work simulated activities for improved function Goal status: INITIAL   5.  Demonstrate at least Rt knee ext strength of 25# in order to  perform work related activities Goal status: INITIAL         PLAN: PT FREQUENCY: 1-2x/week (12 visits approved)   PT DURATION: 12 weeks   PLANNED INTERVENTIONS: Therapeutic exercises, Therapeutic activity, Neuromuscular re-education, Balance training, Gait training, Patient/Family education, Self Care, Joint mobilization, Stair training, Aquatic Therapy, Dry Needling, Electrical stimulation, Cryotherapy, Moist heat, Taping, Vasopneumatic device, Ultrasound, Ionotophoresis 4mg /ml Dexamethasone, Manual therapy, and Re-evaluation   PLAN FOR NEXT SESSION:  continue with quad/hamstring strengthening, pain management strategies PRN, vasopneumatic as needed for pain and swelling    , PT, MPT 07/10/2022, 9:28 AM

## 2022-07-17 ENCOUNTER — Encounter: Payer: Self-pay | Admitting: Sports Medicine

## 2022-07-17 ENCOUNTER — Ambulatory Visit (INDEPENDENT_AMBULATORY_CARE_PROVIDER_SITE_OTHER): Payer: Worker's Compensation | Admitting: Physical Therapy

## 2022-07-17 ENCOUNTER — Encounter: Payer: Self-pay | Admitting: Physical Therapy

## 2022-07-17 DIAGNOSIS — R2689 Other abnormalities of gait and mobility: Secondary | ICD-10-CM

## 2022-07-17 DIAGNOSIS — M25561 Pain in right knee: Secondary | ICD-10-CM

## 2022-07-17 DIAGNOSIS — M6281 Muscle weakness (generalized): Secondary | ICD-10-CM | POA: Diagnosis not present

## 2022-07-17 DIAGNOSIS — R600 Localized edema: Secondary | ICD-10-CM

## 2022-07-17 DIAGNOSIS — M25661 Stiffness of right knee, not elsewhere classified: Secondary | ICD-10-CM | POA: Diagnosis not present

## 2022-07-17 NOTE — Therapy (Signed)
OUTPATIENT PHYSICAL THERAPY TREATMENT NOTE   Patient Name: Jodi Hunt MRN: [id number] DOB:[birth year], 56 y.o., female Today's Date: 07/17/2022  PCP: none REFERRING PROVIDER: Elba Barman, DO   END OF SESSION:   PT End of Session - 07/17/22 0929     Visit Number 3    Number of Visits 12    Date for PT Re-Evaluation 09/20/22    Authorization Type WC approved 12 visits    PT Start Time 0923    PT Stop Time 1005    PT Time Calculation (min) 42 min    Activity Tolerance Patient tolerated treatment well    Behavior During Therapy WFL for tasks assessed/performed              Past Medical History:  Diagnosis Date   Arthritis    Asthma    Hypertension    Past Surgical History:  Procedure Laterality Date   THYROIDECTOMY  1991   There are no problems to display for this patient.   REFERRING DIAG:  M25.561 (ICD-10-CM) - Right knee pain, unspecified chronicity  THERAPY DIAG:  Acute pain of right knee  Stiffness of right knee, not elsewhere classified  Muscle weakness (generalized)  Other abnormalities of gait and mobility  Localized edema  Rationale for Evaluation and Treatment Rehabilitation  PERTINENT HISTORY: hypertension and asthma   PRECAUTIONS: none  SUBJECTIVE: "I don't know" when asked how knee was doing.  Reports it feels like something is going on in the knee, feels weak and maybe "slipping, I don't know."   PAIN:  Are you having pain? Yes: NPRS scale: 7-8/10 Pain location: anterior Rt knee Pain description: achy Aggravating factors: walking, bending Relieving factors: resting, having it drained has seemed to help   OBJECTIVE: (objective measures completed at initial evaluation unless otherwise dated)  DIAGNOSTIC FINDINGS: U/S: Small degree of fluid/hyperemia suggestive of patellar contusion or aseptic prepatellar bursitis    PATIENT SURVEYS:  06/28/22: FOTO 26 (predicted 45)   EDEMA:  06/28/22: small pocket of fluid noted  suprapatellar space, diffuse edema Rt knee but not formally measured   PALPATION: 06/28/22: tenderness to light touch with patella mobility, medial joint line tenderness, pes anserine tenderness   LOWER EXTREMITY ROM:   Active ROM Right eval Left eval Rt 07/10/22  Knee flexion 95 118 120  Knee extension -2 3    (Blank rows = not tested)   Passive ROM Right eval Rt 07/10/22  Knee flexion 120 122  Knee extension 2 2   (Blank rows = not tested)   LOWER EXTREMITY MMT:   MMT Right eval Left eval Right 07/17/22  Knee flexion 3/5  4/5   Knee extension 3/5 (9.1#) 4/5 (24.97#) 26.65#   (Blank rows = not tested)   GAIT: 06/28/22 Distance walked: 100' Assistive device utilized:  had brace on Rt knee Level of assistance: Modified independence Comments: antalgic gait on Rt, decreased stance Rt, decr hip/knee flexion on Rt with circumduction       TODAY'S TREATMENT: 07/17/22 TherEx:  Nustep: L4 x 8 min Leg Press 50# 3x10 bil push; RLE only 25# 3x10 LAQ on Rt 5# 3x10, 3 sec hold Dynamometry testing as noted above Bridges x 20 reps Supine SLR 2x10 on Rt with min cues to maintain extension  Manual: 2 strips of kinesotape split into 3 strips each on 30% stretch for edema to Rt knee.  Anchored mid thigh and placed around knee/patella   07/10/22:  TherEx:  Nustep : 6 minutes Level 4  Slant board: x 2 holding 30 seconds Step ups 4 inch step x 15 each LE LAQ:  2 x 10 each LE holding 3 sec Leg Press: 50# bil LE's 3 x 10 Quad sets: towel under ankle x 15 holding 5 seconds SLR: 2 x 10 extensor lag noted at end of second set Bridges: 2 x 10 Manual Patella mobs Seated knee flexion with IR and distraction Modalities: Vasopneumatic: medium compression 34 degres x 10 minutes   06/28/22 See HEP - performed trial reps with min cues needed for technique     PATIENT EDUCATION:  Education details: HEP Person educated: Patient Education method: Explanation, Demonstration, and  Handouts Education comprehension: verbalized understanding, returned demonstration, and needs further education     HOME EXERCISE PROGRAM: Access Code: [email or web address] URL: [email or web address] Date: 06/28/2022 Prepared by: Faustino Congress   Exercises - Wren Sitting Quad Set  - 2-3 x daily - 7 x weekly - 1 sets - 10 reps - 5 sec hold - Supine Heel Slide with Strap  - 2-3 x daily - 7 x weekly - 1 sets - 10 reps - 2-3 sec hold - Seated Knee Extension AROM  - 2-3 x daily - 7 x weekly - 1 sets - 10 reps - 5 sec hold hold   ASSESSMENT:   CLINICAL IMPRESSION: Pt tolerated session well today with improvement in Rt quad strength today, meeting LTG #5.  Overall despite elevated pain and return of patellar swelling functional improvements noted.  Trial of KT tape today to see if this is beneficial.  Will continue to benefit from PT to maximize function.   OBJECTIVE IMPAIRMENTS Abnormal gait, decreased mobility, difficulty walking, decreased ROM, decreased strength, increased edema, and pain.    ACTIVITY LIMITATIONS carrying, lifting, bending, sitting, standing, squatting, stairs, transfers, bed mobility, locomotion level, and caring for others   PARTICIPATION LIMITATIONS: meal prep, cleaning, laundry, driving, shopping, community activity, and occupation   PERSONAL FACTORS 1-2 comorbidities: HTN, asthma  are also affecting patient's functional outcome.    REHAB POTENTIAL: Good   CLINICAL DECISION MAKING: Stable/uncomplicated   EVALUATION COMPLEXITY: Low     GOALS: Goals reviewed with patient? Yes   SHORT TERM GOALS: Target date: 07/26/2022   Independent with initial HEP Goal status: on-going 07/10/22   2.  Rt knee AROM improved 0-110 for improved mobility Goal status: INITIAL     [place] TERM GOALS: Target date: 09/20/2022   Independent with final HEP Goal status: INITIAL   2.  FOTO score improved to 45 Goal status: INITIAL   3.  Rt knee AROM improved to 0-120 for  improved function Goal status: INIITAL   4.  Report pain < 2/10 with work simulated activities for improved function Goal status: INITIAL   5.  Demonstrate at least Rt knee ext strength of 25# in order to perform work related activities Goal status: MET 07/17/22         PLAN: PT FREQUENCY: 1-2x/week (12 visits approved)   PT DURATION: 12 weeks   PLANNED INTERVENTIONS: Therapeutic exercises, Therapeutic activity, Neuromuscular re-education, Balance training, Gait training, Patient/Family education, Self Care, Joint mobilization, Stair training, Aquatic Therapy, Dry Needling, Electrical stimulation, Cryotherapy, Moist heat, Taping, Vasopneumatic device, Ultrasound, Ionotophoresis 28m/ml Dexamethasone, Manual therapy, and Re-evaluation   PLAN FOR NEXT SESSION:   continue with quad/hamstring strengthening, pain management strategies PRN, vasopneumatic as needed for pain and swelling, measure extension/flexion    SLaureen Abrahams PT, DPT 07/17/22 10:21 AM

## 2022-07-24 ENCOUNTER — Encounter: Payer: Self-pay | Admitting: Physical Therapy

## 2022-07-26 ENCOUNTER — Encounter: Payer: Self-pay | Admitting: Physical Therapy

## 2022-07-29 ENCOUNTER — Encounter: Payer: Self-pay | Admitting: Sports Medicine

## 2022-07-29 ENCOUNTER — Ambulatory Visit (INDEPENDENT_AMBULATORY_CARE_PROVIDER_SITE_OTHER): Payer: Worker's Compensation | Admitting: Sports Medicine

## 2022-07-29 DIAGNOSIS — M25561 Pain in right knee: Secondary | ICD-10-CM | POA: Diagnosis not present

## 2022-07-29 DIAGNOSIS — M7041 Prepatellar bursitis, right knee: Secondary | ICD-10-CM

## 2022-07-29 DIAGNOSIS — S8001XS Contusion of right knee, sequela: Secondary | ICD-10-CM

## 2022-07-29 MED ORDER — MELOXICAM 15 MG PO TABS: 15.0000 mg | ORAL_TABLET | Freq: Every day | ORAL | 1 refills | Status: DC

## 2022-07-29 NOTE — Progress Notes (Signed)
Jodi Hunt - 56 y.o. female MRN [id number]  Date of birth: 1966/03/21  Office Visit Note: Visit Date: 07/29/2022 PCP: Patient, No Pcp Per Referred by: No ref. provider found  Subjective: Chief Complaint  Patient presents with   Right Knee - Pain, Follow-up   HPI: Jodi Hunt is a pleasant 56 y.o. female who presents today for follow-up of right knee pain.  She did have an exacerbation of her prepatellar bursitis a few weeks ago and did need a day or 2 off work to rest, ice and elevate.  She has had a few sessions of physical therapy, finding good improvement.  However she had a recurrence of swelling and has had to pull back from any therapy or exercise in the meantime.  She takes ibuprofen twice a day.  The swelling over the top of the knee has improved, although there is still some residual swelling.  She denies any new injury.  She is okay with getting back to work.  She has not been wearing a knee brace.   Pertinent ROS were reviewed with the patient and found to be negative unless otherwise specified above in HPI.   Assessment & Plan: Visit Diagnoses:  1. Prepatellar bursitis, right knee   2. Contusion of right knee, sequela   3. Right knee pain, unspecified chronicity    Plan: Had a discussion with Jodi Hunt today regarding the recent mild fluid accumulation within the prepatellar bursa.  This has gotten better with ice, relative rest.  I am reassured that this is not causing any damage to her knee.  I would like to get her into a knee compressive sleeve, I did print out and give her a few examples to obtain over-the-counter at her discretion.  I think the compression will help the fluid from reaccumulating.  We will also get her started on meloxicam 15 mg to be taken once daily with food.  I discussed with her that she is free to return to full duty without restrictions.  Would recommend not kneeling on her knees.  She may have an occasional flare of the fluid  returning, but this should respond well to compression sleeve, ice and NSAIDs.  If the fluid keeps reaccumulating and/or causing her pain over the coming months, it is always possible to do a bursectomy, although this would be last resort and likely not needed at least at this point.  She is agreeable to this.  We will follow-up in 6 weeks to see how things are going.  Follow-up: Return in about 6 weeks (around 09/09/2022).   Meds & Orders:  Meds ordered this encounter  Medications   meloxicam (MOBIC) 15 MG tablet    Sig: Take 1 tablet (15 mg total) by mouth daily.    Dispense:  30 tablet    Refill:  1   No orders of the defined types were placed in this encounter.    Procedures: No procedures performed      Clinical History: No specialty comments available.  She reports that she has never smoked. She has never used smokeless tobacco. No results for input(s): "HGBA1C", "LABURIC" in the last 8760 hours.  Objective:    Physical Exam  Gen: Well-appearing, in no acute distress; non-toxic CV: Regular Rate. Well-perfused. Warm.  Resp: Breathing unlabored on room air; no wheezing. Psych: Fluid speech in conversation; appropriate affect; normal thought process Neuro: Sensation intact throughout. No gross coordination deficits.   Ortho Exam -Right knee: Evaluation of the right  knee does show a small amount of fluid accumulation within the prepatellar bursa without any redness or erythema.  There is no joint effusion about the knee.  Range of motion preserved from 0-130 degrees.  Some tenderness to palpation over top of the kneecap near the prepatellar bursa.  No varus or valgus instability.  Ligamentously intact.  Strength 5/5 throughout.  Imaging: No results found.  Past Medical/Family/Surgical/Social History: Medications & Allergies reviewed per EMR, new medications updated. There are no problems to display for this patient.  Past Medical History:  Diagnosis Date   Arthritis     Asthma    Hypertension    Family History  Problem Relation Age of Onset   Hyperlipidemia Mother    Hypertension Mother    Cancer Father    Diabetes Brother    Hypertension Brother    Past Surgical History:  Procedure Laterality Date   THYROIDECTOMY  22   Social History   Occupational History   Not on file  Tobacco Use   Smoking status: Never   Smokeless tobacco: Never  Substance and Sexual Activity   Alcohol use: No   Drug use: No   Sexual activity: Not on file

## 2022-07-29 NOTE — Progress Notes (Signed)
Follow up on right knee; complains that the swelling has come back.  Denies usage of a brace. States it is more of an achey pain. Advil for pain PRN

## 2022-07-29 NOTE — Patient Instructions (Signed)
Obtain Knee Compression Sleeve (neoprene compression):  One that I have found provides good response for people are the Body Helix ones: https://www.bodyhelix.com/products/full-knee-helix  You can find ones over-the-counter but make sure they have good compression around the knee and don't fall down   - Begin Meloxicam 15mg  once a day (stop the ibuprofen)

## 2022-07-31 ENCOUNTER — Encounter: Payer: Self-pay | Admitting: Physical Therapy

## 2022-07-31 ENCOUNTER — Ambulatory Visit (INDEPENDENT_AMBULATORY_CARE_PROVIDER_SITE_OTHER): Payer: Worker's Compensation | Admitting: Physical Therapy

## 2022-07-31 DIAGNOSIS — R600 Localized edema: Secondary | ICD-10-CM

## 2022-07-31 DIAGNOSIS — M25661 Stiffness of right knee, not elsewhere classified: Secondary | ICD-10-CM

## 2022-07-31 DIAGNOSIS — M6281 Muscle weakness (generalized): Secondary | ICD-10-CM | POA: Diagnosis not present

## 2022-07-31 DIAGNOSIS — R2689 Other abnormalities of gait and mobility: Secondary | ICD-10-CM | POA: Diagnosis not present

## 2022-07-31 DIAGNOSIS — M25561 Pain in right knee: Secondary | ICD-10-CM | POA: Diagnosis not present

## 2022-07-31 NOTE — Therapy (Signed)
OUTPATIENT PHYSICAL THERAPY TREATMENT NOTE   Patient Name: NAMRATA DANGLER MRN: [id number] DOB:15-Aug-1966, 56 y.o., female Today's Date: 07/31/2022  PCP: none REFERRING PROVIDER: Elba Barman, DO   END OF SESSION:   PT End of Session - 07/31/22 1608     Visit Number 4    Number of Visits 12    Date for PT Re-Evaluation 09/20/22    Authorization Type WC approved 12 visits    PT Start Time 1600    PT Stop Time 1638    PT Time Calculation (min) 38 min    Activity Tolerance Patient tolerated treatment well    Behavior During Therapy WFL for tasks assessed/performed              Past Medical History:  Diagnosis Date   Arthritis    Asthma    Hypertension    Past Surgical History:  Procedure Laterality Date   THYROIDECTOMY  1991   There are no problems to display for this patient.   REFERRING DIAG:  M25.561 (ICD-10-CM) - Right knee pain, unspecified chronicity  THERAPY DIAG:  Acute pain of right knee  Stiffness of right knee, not elsewhere classified  Muscle weakness (generalized)  Other abnormalities of gait and mobility  Localized edema  Rationale for Evaluation and Treatment Rehabilitation  PERTINENT HISTORY: hypertension and asthma   PRECAUTIONS: none  SUBJECTIVE: "knee is doing okay, still with some pain in the front"  PAIN:  Are you having pain? Yes: NPRS scale: 5/10 Pain location: anterior Rt knee Pain description: achy Aggravating factors: walking, bending Relieving factors: resting, having it drained has seemed to help   OBJECTIVE: (objective measures completed at initial evaluation unless otherwise dated)  DIAGNOSTIC FINDINGS: U/S: Small degree of fluid/hyperemia suggestive of patellar contusion or aseptic prepatellar bursitis    PATIENT SURVEYS:  06/28/22: FOTO 26 (predicted 45)   EDEMA:  06/28/22: small pocket of fluid noted suprapatellar space, diffuse edema Rt knee but not formally measured   PALPATION: 06/28/22: tenderness to  light touch with patella mobility, medial joint line tenderness, pes anserine tenderness   LOWER EXTREMITY ROM:   Active ROM Right eval Left eval Rt 07/10/22  Knee flexion 95 118 120  Knee extension -2 3    (Blank rows = not tested)   Passive ROM Right eval Rt 07/10/22  Knee flexion 120 122  Knee extension 2 2   (Blank rows = not tested)   LOWER EXTREMITY MMT:   MMT Right eval Left eval Right 07/17/22  Knee flexion 3/5  4/5   Knee extension 3/5 (9.1#) 4/5 (24.97#) 26.65#   (Blank rows = not tested)   GAIT: 06/28/22 Distance walked: 100' Assistive device utilized:  had brace on Rt knee Level of assistance: Modified independence Comments: antalgic gait on Rt, decreased stance Rt, decr hip/knee flexion on Rt with circumduction       TODAY'S TREATMENT: 07/31/22 TherEx:  Nustep: L5 x 8 min Leg Press 50# 3x10 bil push; RLE only 25# 3x10 LAQ on Rt 5# 3x10, 3 sec hold Seated hamstring curl Green 2X10 Seated SLR 2X10 on Rt  Sit to stand no UE support with slow eccentric lower X 10 Seated knee flexion stretch AAROM 5 sec X10  Manual: Declined KT tape today  07/17/22 TherEx:  Nustep: L4 x 8 min Leg Press 50# 3x10 bil push; RLE only 25# 3x10 LAQ on Rt 5# 3x10, 3 sec hold Dynamometry testing as noted above Bridges x 20 reps Supine SLR 2x10 on Rt  with min cues to maintain extension  Manual: 2 strips of kinesotape split into 3 strips each on 30% stretch for edema to Rt knee.  Anchored mid thigh and placed around knee/patella   07/10/22:  TherEx:  Nustep : 6 minutes Level 4 Slant board: x 2 holding 30 seconds Step ups 4 inch step x 15 each LE LAQ:  2 x 10 each LE holding 3 sec Leg Press: 50# bil LE's 3 x 10 Quad sets: towel under ankle x 15 holding 5 seconds SLR: 2 x 10 extensor lag noted at end of second set Bridges: 2 x 10 Manual Patella mobs Seated knee flexion with IR and distraction Modalities: Vasopneumatic: medium compression 34 degres x 10  minutes   06/28/22 See HEP - performed trial reps with min cues needed for technique     PATIENT EDUCATION:  Education details: HEP Person educated: Patient Education method: Explanation, Demonstration, and Handouts Education comprehension: verbalized understanding, returned demonstration, and needs further education     HOME EXERCISE PROGRAM: Access Code: [email or web address] URL: [email or web address] Date: 06/28/2022 Prepared by: Faustino Congress   Exercises - [place] Sitting Quad Set  - 2-3 x daily - 7 x weekly - 1 sets - 10 reps - 5 sec hold - Supine Heel Slide with Strap  - 2-3 x daily - 7 x weekly - 1 sets - 10 reps - 2-3 sec hold - Seated Knee Extension AROM  - 2-3 x daily - 7 x weekly - 1 sets - 10 reps - 5 sec hold hold   ASSESSMENT:   CLINICAL IMPRESSION: She had good tolerance to strength progressions today without any major complaints. She deferred KT tape today as she was recommended knee compression sleeve so will be getting that. PT recommending to continue current POC.   OBJECTIVE IMPAIRMENTS Abnormal gait, decreased mobility, difficulty walking, decreased ROM, decreased strength, increased edema, and pain.    ACTIVITY LIMITATIONS carrying, lifting, bending, sitting, standing, squatting, stairs, transfers, bed mobility, locomotion level, and caring for others   PARTICIPATION LIMITATIONS: meal prep, cleaning, laundry, driving, shopping, community activity, and occupation   PERSONAL FACTORS 1-2 comorbidities: HTN, asthma  are also affecting patient's functional outcome.    REHAB POTENTIAL: Good   CLINICAL DECISION MAKING: Stable/uncomplicated   EVALUATION COMPLEXITY: Low     GOALS: Goals reviewed with patient? Yes   SHORT TERM GOALS: Target date: 07/26/2022   Independent with initial HEP Goal status: on-going 07/10/22   2.  Rt knee AROM improved 0-110 for improved mobility Goal status: INITIAL     [place] TERM GOALS: Target date: 09/20/2022    Independent with final HEP Goal status: INITIAL   2.  FOTO score improved to 45 Goal status: INITIAL   3.  Rt knee AROM improved to 0-120 for improved function Goal status: INIITAL   4.  Report pain < 2/10 with work simulated activities for improved function Goal status: INITIAL   5.  Demonstrate at least Rt knee ext strength of 25# in order to perform work related activities Goal status: MET 07/17/22         PLAN: PT FREQUENCY: 1-2x/week (12 visits approved)   PT DURATION: 12 weeks   PLANNED INTERVENTIONS: Therapeutic exercises, Therapeutic activity, Neuromuscular re-education, Balance training, Gait training, Patient/Family education, Self Care, Joint mobilization, Stair training, Aquatic Therapy, Dry Needling, Electrical stimulation, Cryotherapy, Moist heat, Taping, Vasopneumatic device, Ultrasound, Ionotophoresis 44m/ml Dexamethasone, Manual therapy, and Re-evaluation   PLAN FOR NEXT SESSION:   continue with quad/hamstring strengthening, pain  management strategies PRN, vasopneumatic as needed for pain and swelling, measure extension/flexion  Elsie Ra, PT, DPT 07/31/22 4:37 PM

## 2022-08-01 ENCOUNTER — Other Ambulatory Visit: Payer: Self-pay | Admitting: Sports Medicine

## 2022-08-02 ENCOUNTER — Encounter: Payer: Self-pay | Admitting: Rehabilitative and Restorative Service Providers"

## 2022-08-02 ENCOUNTER — Ambulatory Visit (INDEPENDENT_AMBULATORY_CARE_PROVIDER_SITE_OTHER): Payer: Worker's Compensation | Admitting: Rehabilitative and Restorative Service Providers"

## 2022-08-02 DIAGNOSIS — M25661 Stiffness of right knee, not elsewhere classified: Secondary | ICD-10-CM | POA: Diagnosis not present

## 2022-08-02 DIAGNOSIS — M6281 Muscle weakness (generalized): Secondary | ICD-10-CM | POA: Diagnosis not present

## 2022-08-02 DIAGNOSIS — R2689 Other abnormalities of gait and mobility: Secondary | ICD-10-CM

## 2022-08-02 DIAGNOSIS — R600 Localized edema: Secondary | ICD-10-CM

## 2022-08-02 NOTE — Therapy (Signed)
OUTPATIENT PHYSICAL THERAPY TREATMENT/PROGRESS NOTE   Patient Name: ANABELLE BUNGERT MRN: [id number] DOB:1965/11/20, 56 y.o., female Today's Date: 08/02/2022  PCP: none REFERRING PROVIDER: Elba Barman, DO   Progress Note Reporting Period 06/28/2022 to 08/02/2022  See note below for Objective Data and Assessment of Progress/Goals.      END OF SESSION:   PT End of Session - 08/02/22 0935     Visit Number 5    Number of Visits 12    Date for PT Re-Evaluation 09/20/22    Authorization Type WC approved 12 visits    PT Start Time 0930    PT Stop Time 1016    PT Time Calculation (min) 46 min    Activity Tolerance Patient tolerated treatment well    Behavior During Therapy WFL for tasks assessed/performed             Past Medical History:  Diagnosis Date   Arthritis    Asthma    Hypertension    Past Surgical History:  Procedure Laterality Date   THYROIDECTOMY  1991   There are no problems to display for this patient.   REFERRING DIAG:  M25.561 (ICD-10-CM) - Right knee pain, unspecified chronicity  THERAPY DIAG:  Stiffness of right knee, not elsewhere classified  Muscle weakness (generalized)  Other abnormalities of gait and mobility  Localized edema  Rationale for Evaluation and Treatment Rehabilitation  PERTINENT HISTORY: hypertension and asthma   PRECAUTIONS: none  SUBJECTIVE: Farha feels stronger than before starting PT.  She still doesn't trust her R knee with stairs, particularly descending.  PAIN:  Are you having pain? Yes: NPRS scale: This week 3-5/10 Pain location: anterior Rt knee Pain description: achy Aggravating factors: walking, bending Relieving factors: resting, having it drained has helped   OBJECTIVE: (objective measures completed at initial evaluation unless otherwise dated)  DIAGNOSTIC FINDINGS: U/S: Small degree of fluid/hyperemia suggestive of patellar contusion or aseptic prepatellar bursitis    PATIENT SURVEYS:   08/02/22: FOTO 59 (Met) 06/28/22: FOTO 26 (predicted 45)   EDEMA:  06/28/22: small pocket of fluid noted suprapatellar space, diffuse edema Rt knee but not formally measured   PALPATION: 06/28/22: tenderness to light touch with patella mobility, medial joint line tenderness, pes anserine tenderness   LOWER EXTREMITY ROM:   Active ROM Right eval Left eval Rt 07/10/22 Left/Right 08/02/2022  Knee flexion 95 118 120 130/121  Knee extension -2 3  0/-2   (Blank rows = not tested)   Passive ROM Right eval Rt 07/10/22  Knee flexion 120 122  Knee extension 2 2   (Blank rows = not tested)   LOWER EXTREMITY MMT:   MMT Right eval Left eval Right 07/17/22 Left/Right 08/02/22  Knee flexion 3/5  4/5    Knee extension 3/5 (9.1#) 4/5 (24.97#) 26.65# 45.0/31.9   (Blank rows = not tested)   GAIT: 06/28/22 Distance walked: 100' Assistive device utilized:  had brace on Rt knee Level of assistance: Modified independence Comments: antalgic gait on Rt, decreased stance Rt, decr hip/knee flexion on Rt with circumduction     TODAY'S TREATMENT: 08/02/22 Recumbent bike Seat 4 Level 5 for 8 minutes Seated straight leg raises 3 sets of 5 for 3 seconds (toes back, press knee down, tighten thigh, pause 2 seconds, lift 4-6 inches, hold 3 seconds, slowly lower, relax)  Functional Activities for sit to stand and stairs: Sit to stand with slow eccentrics 5X Leg Press Double Leg slow eccentrics 87# 15X Single Leg Right only slow eccentrics  37# 10X Lateral step down off 4 inch step 10X Bil slow eccentrics   07/31/22 TherEx:  Nustep: L5 x 8 min Leg Press 50# 3x10 bil push; RLE only 25# 3x10 LAQ on Rt 5# 3x10, 3 sec hold Seated hamstring curl Green 2X10 Seated SLR 2X10 on Rt  Sit to stand no UE support with slow eccentric lower X 10 Seated knee flexion stretch AAROM 5 sec X10  Manual: Declined KT tape today   07/17/22 TherEx:  Nustep: L4 x 8 min Leg Press 50# 3x10 bil push; RLE only 25# 3x10 LAQ  on Rt 5# 3x10, 3 sec hold Dynamometry testing as noted above Bridges x 20 reps Supine SLR 2x10 on Rt with min cues to maintain extension  Manual: 2 strips of kinesotape split into 3 strips each on 30% stretch for edema to Rt knee.  Anchored mid thigh and placed around knee/patella    PATIENT EDUCATION:  Education details: HEP Person educated: Patient Education method: Explanation, Demonstration, and Handouts Education comprehension: verbalized understanding, returned demonstration, and needs further education     HOME EXERCISE PROGRAM: Access Code: [email or web address] URL: [email or web address] Date: 08/02/2022 Prepared by: Vista Mink  Exercises - [place] Sitting Quad Set  - 2-3 x daily - 7 x weekly - 1 sets - 10 reps - 5 sec hold - Supine Heel Slide with Strap  - 2-3 x daily - 7 x weekly - 1 sets - 10 reps - 2-3 sec hold - Seated Knee Extension AROM  - 2-3 x daily - 7 x weekly - 1 sets - 10 reps - 5 sec hold hold - Small Range Straight Leg Raise  - 1-2 x daily - 7 x weekly - 4-10 sets - 5 reps - 3 seconds hold    ASSESSMENT:   CLINICAL IMPRESSION: Sayda is making good objective and functional progress towards F[place]her-term goals established at evaluation.  AROM appears to be only edema limited and Right quadriceps strength is 70% of the uninvolved Left (both sides are weaker than expected).  Sreenidhi will benefit from continued strength and functional work to help instability, apprehension with stairs and address remaining Right knee pain.   OBJECTIVE IMPAIRMENTS Abnormal gait, decreased mobility, difficulty walking, decreased ROM, decreased strength, increased edema, and pain.    ACTIVITY LIMITATIONS carrying, lifting, bending, sitting, standing, squatting, stairs, transfers, bed mobility, locomotion level, and caring for others   PARTICIPATION LIMITATIONS: meal prep, cleaning, laundry, driving, shopping, community activity, and occupation   PERSONAL FACTORS 1-2  comorbidities: HTN, asthma  are also affecting patient's functional outcome.    REHAB POTENTIAL: Good   CLINICAL DECISION MAKING: Stable/uncomplicated   EVALUATION COMPLEXITY: Low     GOALS: Goals reviewed with patient? Yes   SHORT TERM GOALS: Target date: 07/26/2022   Independent with initial HEP Goal status: Met 08/02/22   2.  Rt knee AROM improved 0-110 for improved mobility Goal status: Partially Met 08/02/2022     [place]alin TERM GOALS: Target date: 09/20/2022   Independent with final HEP Goal status: INITIAL   2.  FOTO score improved to 45 Goal status: Met 08/02/2022   3.  Rt knee AROM improved to 0-120 for improved function Goal status: Partially Met 08/02/2022   4.  Report pain < 2/10 with work simulated activities for improved function Goal status: On Going 08/02/2022   5.  Demonstrate at least Rt knee ext strength of 25# in order to perform work related activities Goal status: MET 07/17/22, New Goal at least 50# 08/02/2022  PLAN: PT FREQUENCY: 1-2x/week (12 visits approved)   PT DURATION: 12 weeks   PLANNED INTERVENTIONS: Therapeutic exercises, Therapeutic activity, Neuromuscular re-education, Balance training, Gait training, Patient/Family education, Self Care, Joint mobilization, Stair training, Aquatic Therapy, Dry Needling, Electrical stimulation, Cryotherapy, Moist heat, Taping, Vasopneumatic device, Ultrasound, Ionotophoresis 4mg /ml Dexamethasone, Manual therapy, and Re-evaluation   PLAN FOR NEXT SESSION:  Continue with quadriceps strengthening and WB functional progressions, pain management strategies PRN, vasopneumatic as needed for pain and swelling  Farley Ly, PT, MPT 08/02/22 12:28 PM

## 2022-08-05 ENCOUNTER — Ambulatory Visit (INDEPENDENT_AMBULATORY_CARE_PROVIDER_SITE_OTHER): Payer: Worker's Compensation | Admitting: Physical Therapy

## 2022-08-05 ENCOUNTER — Encounter: Payer: Self-pay | Admitting: Rehabilitative and Restorative Service Providers"

## 2022-08-05 ENCOUNTER — Encounter: Payer: Self-pay | Admitting: Physical Therapy

## 2022-08-05 DIAGNOSIS — R2689 Other abnormalities of gait and mobility: Secondary | ICD-10-CM | POA: Diagnosis not present

## 2022-08-05 DIAGNOSIS — M6281 Muscle weakness (generalized): Secondary | ICD-10-CM | POA: Diagnosis not present

## 2022-08-05 DIAGNOSIS — M25661 Stiffness of right knee, not elsewhere classified: Secondary | ICD-10-CM | POA: Diagnosis not present

## 2022-08-05 DIAGNOSIS — R600 Localized edema: Secondary | ICD-10-CM

## 2022-08-05 DIAGNOSIS — M25561 Pain in right knee: Secondary | ICD-10-CM

## 2022-08-05 NOTE — Therapy (Signed)
OUTPATIENT PHYSICAL THERAPY TREATMENT/PROGRESS NOTE   Patient Name: Jodi Hunt MRN: [id number] DOB:11/12/1965, 56 y.o., female Today's Date: 08/05/2022  PCP: none REFERRING PROVIDER: Elba Barman, DO     END OF SESSION:   PT End of Session - 08/05/22 1547     Visit Number 6    Number of Visits 12    Date for PT Re-Evaluation 09/20/22    Authorization Type WC approved 12 visits    PT Start Time 1510    PT Stop Time 1548    PT Time Calculation (min) 38 min    Activity Tolerance Patient tolerated treatment well    Behavior During Therapy WFL for tasks assessed/performed              Past Medical History:  Diagnosis Date   Arthritis    Asthma    Hypertension    Past Surgical History:  Procedure Laterality Date   THYROIDECTOMY  1991   There are no problems to display for this patient.   REFERRING DIAG:  M25.561 (ICD-10-CM) - Right knee pain, unspecified chronicity  THERAPY DIAG:  Stiffness of right knee, not elsewhere classified  Muscle weakness (generalized)  Other abnormalities of gait and mobility  Localized edema  Acute pain of right knee  Rationale for Evaluation and Treatment Rehabilitation  PERTINENT HISTORY: hypertension and asthma   PRECAUTIONS: none  SUBJECTIVE: She relays about 5/10 knee pain upon arrival, feels sore.  PAIN:  Are you having pain? Yes: NPRS scale: This week 3-5/10 Pain location: anterior Rt knee Pain description: achy Aggravating factors: walking, bending Relieving factors: resting, having it drained has helped   OBJECTIVE: (objective measures completed at initial evaluation unless otherwise dated)  DIAGNOSTIC FINDINGS: U/S: Small degree of fluid/hyperemia suggestive of patellar contusion or aseptic prepatellar bursitis    PATIENT SURVEYS:  08/02/22: FOTO 59 (Met) 06/28/22: FOTO 26 (predicted 45)   EDEMA:  06/28/22: small pocket of fluid noted suprapatellar space, diffuse edema Rt knee but not formally  measured   PALPATION: 06/28/22: tenderness to light touch with patella mobility, medial joint line tenderness, pes anserine tenderness   LOWER EXTREMITY ROM:   Active ROM Right eval Left eval Rt 07/10/22 Left/Right 08/02/2022  Knee flexion 95 118 120 130/121  Knee extension -2 3  0/-2   (Blank rows = not tested)   Passive ROM Right eval Rt 07/10/22  Knee flexion 120 122  Knee extension 2 2   (Blank rows = not tested)   LOWER EXTREMITY MMT:   MMT Right eval Left eval Right 07/17/22 Left/Right 08/02/22  Knee flexion 3/5  4/5    Knee extension 3/5 (9.1#) 4/5 (24.97#) 26.65# 45.0/31.9   (Blank rows = not tested)   GAIT: 06/28/22 Distance walked: 100' Assistive device utilized:  had brace on Rt knee Level of assistance: Modified independence Comments: antalgic gait on Rt, decreased stance Rt, decr hip/knee flexion on Rt with circumduction     TODAY'S TREATMENT: 08/05/22 TherEx:  Nustep: L6 x 8 min LAQ  5# X 20 alternating bilat Seated SLR 2X10 on Rt  Standing hamstring curls 5# X 15 alternating bilat Seated knee flexion stretch AAROM 5 sec X15  Theractivity Leg Press 87# 2x10 bil push; RLE only 37# 2x10 Step ups 6 inch X 10 fwd with one UE support Step ups 6 inch X 10 lateral with one UE support Sit to stand no UE support with slow eccentric lower X 10  08/02/22 Recumbent bike Seat 4 Level 5 for 8  minutes Seated straight leg raises 3 sets of 5 for 3 seconds (toes back, press knee down, tighten thigh, pause 2 seconds, lift 4-6 inches, hold 3 seconds, slowly lower, relax)  Functional Activities for sit to stand and stairs: Sit to stand with slow eccentrics 5X Leg Press Double Leg slow eccentrics 87# 15X Single Leg Right only slow eccentrics 37# 10X Lateral step down off 4 inch step 10X Bil slow eccentrics   07/31/22 TherEx:  Nustep: L5 x 8 min Leg Press 50# 3x10 bil push; RLE only 25# 3x10 LAQ on Rt 5# 3x10, 3 sec hold Seated hamstring curl Green 2X10 Seated SLR  2X10 on Rt  Sit to stand no UE support with slow eccentric lower X 10 Seated knee flexion stretch AAROM 5 sec X10  Manual: Declined KT tape today      PATIENT EDUCATION:  Education details: HEP Person educated: Patient Education method: Explanation, Demonstration, and Handouts Education comprehension: verbalized understanding, returned demonstration, and needs further education     HOME EXERCISE PROGRAM: Access Code: [email or web address] URL: [email or web address] Date: 08/02/2022 Prepared by: Vista Mink  Exercises - [place] Sitting Quad Set  - 2-3 x daily - 7 x weekly - 1 sets - 10 reps - 5 sec hold - Supine Heel Slide with Strap  - 2-3 x daily - 7 x weekly - 1 sets - 10 reps - 2-3 sec hold - Seated Knee Extension AROM  - 2-3 x daily - 7 x weekly - 1 sets - 10 reps - 5 sec hold hold - Small Range Straight Leg Raise  - 1-2 x daily - 7 x weekly - 4-10 sets - 5 reps - 3 seconds hold    ASSESSMENT:   CLINICAL IMPRESSION: We continued to work to improve her Rt knee strength and ROM as tolerated to improve her functional abilities. PT recommending to continue current plan.    OBJECTIVE IMPAIRMENTS Abnormal gait, decreased mobility, difficulty walking, decreased ROM, decreased strength, increased edema, and pain.    ACTIVITY LIMITATIONS carrying, lifting, bending, sitting, standing, squatting, stairs, transfers, bed mobility, locomotion level, and caring for others   PARTICIPATION LIMITATIONS: meal prep, cleaning, laundry, driving, shopping, community activity, and occupation   PERSONAL FACTORS 1-2 comorbidities: HTN, asthma  are also affecting patient's functional outcome.    REHAB POTENTIAL: Good   CLINICAL DECISION MAKING: Stable/uncomplicated   EVALUATION COMPLEXITY: Low     GOALS: Goals reviewed with patient? Yes   SHORT TERM GOALS: Target date: 07/26/2022   Independent with initial HEP Goal status: Met 08/02/22   2.  Rt knee AROM improved 0-110 for improved  mobility Goal status: Partially Met 08/02/2022     [place] TERM GOALS: Target date: 09/20/2022   Independent with final HEP Goal status: ongoing will need updates PRN   2.  FOTO score improved to 45 Goal status: Met 08/02/2022   3.  Rt knee AROM improved to 0-120 for improved function Goal status: Partially Met 08/02/2022   4.  Report pain < 2/10 with work simulated activities for improved function Goal status: On Going 08/02/2022   5.  Demonstrate at least Rt knee ext strength of 25# in order to perform work related activities Goal status: MET 07/17/22, New Goal at least 50# 08/02/2022         PLAN: PT FREQUENCY: 1-2x/week (12 visits approved)   PT DURATION: 12 weeks   PLANNED INTERVENTIONS: Therapeutic exercises, Therapeutic activity, Neuromuscular re-education, Balance training, Gait training, Patient/Family education, Self Care, Joint mobilization, Stair  training, Aquatic Therapy, Dry Needling, Electrical stimulation, Cryotherapy, Moist heat, Taping, Vasopneumatic device, Ultrasound, Ionotophoresis 66m/ml Dexamethasone, Manual therapy, and Re-evaluation   PLAN FOR NEXT SESSION:  Continue with quadriceps strengthening and WB functional progressions BElsie Ra PT, DPT 08/05/22 3:48 PM

## 2022-08-07 ENCOUNTER — Encounter: Payer: Self-pay | Admitting: Physical Therapy

## 2022-08-08 ENCOUNTER — Encounter: Payer: Self-pay | Admitting: Physical Therapy

## 2022-08-09 ENCOUNTER — Ambulatory Visit (INDEPENDENT_AMBULATORY_CARE_PROVIDER_SITE_OTHER): Payer: Worker's Compensation | Admitting: Rehabilitative and Restorative Service Providers"

## 2022-08-09 ENCOUNTER — Encounter: Payer: Self-pay | Admitting: Physical Therapy

## 2022-08-09 ENCOUNTER — Encounter: Payer: Self-pay | Admitting: Rehabilitative and Restorative Service Providers"

## 2022-08-09 DIAGNOSIS — M25661 Stiffness of right knee, not elsewhere classified: Secondary | ICD-10-CM

## 2022-08-09 DIAGNOSIS — M25561 Pain in right knee: Secondary | ICD-10-CM

## 2022-08-09 DIAGNOSIS — R600 Localized edema: Secondary | ICD-10-CM

## 2022-08-09 DIAGNOSIS — M6281 Muscle weakness (generalized): Secondary | ICD-10-CM

## 2022-08-09 DIAGNOSIS — R2689 Other abnormalities of gait and mobility: Secondary | ICD-10-CM

## 2022-08-09 NOTE — Therapy (Signed)
OUTPATIENT PHYSICAL THERAPY TREATMENT   Patient Name: Jodi Hunt MRN: [id number] DOB:[birth year], 56 y.o., female Today's Date: 08/09/2022  PCP: none REFERRING PROVIDER: Elba Barman, DO     END OF SESSION:   PT End of Session - 08/09/22 0804     Visit Number 7    Number of Visits 12    Date for PT Re-Evaluation 09/20/22    Authorization Type WC approved 12 visits    PT Start Time 0800    PT Stop Time 0839    PT Time Calculation (min) 39 min    Activity Tolerance Patient tolerated treatment well    Behavior During Therapy WFL for tasks assessed/performed               Past Medical History:  Diagnosis Date   Arthritis    Asthma    Hypertension    Past Surgical History:  Procedure Laterality Date   THYROIDECTOMY  1991   There are no problems to display for this patient.   REFERRING DIAG:  M25.561 (ICD-10-CM) - Right knee pain, unspecified chronicity  THERAPY DIAG:  Acute pain of right knee  Stiffness of right knee, not elsewhere classified  Muscle weakness (generalized)  Other abnormalities of gait and mobility  Localized edema  Rationale for Evaluation and Treatment Rehabilitation  PERTINENT HISTORY: hypertension and asthma   PRECAUTIONS: none  SUBJECTIVE: Pt indicated no pain upon arrival.  Pt indicated some good and bad days since last visit.  Feeling stiffness and pain.    PAIN:  NPRS scale: up to 7/10 at night in last few days Pain location: anterior Rt knee Pain description: achy Aggravating factors: morning sometimes, evening after activity.  Relieving factors: ice, pain meds.    OBJECTIVE: (objective measures completed at initial evaluation unless otherwise dated)  DIAGNOSTIC FINDINGS: U/S: Small degree of fluid/hyperemia suggestive of patellar contusion or aseptic prepatellar bursitis    PATIENT SURVEYS:  08/02/22: FOTO 59 (Met) 06/28/22: FOTO 26 (predicted 45)   EDEMA:  06/28/22: small pocket of fluid noted suprapatellar  space, diffuse edema Rt knee but not formally measured   PALPATION: 06/28/22: tenderness to light touch with patella mobility, medial joint line tenderness, pes anserine tenderness   LOWER EXTREMITY ROM:   Active ROM Right eval Left eval Rt 07/10/22 Left/Right 08/02/2022  Knee flexion 95 118 120 130/121  Knee extension -2 3  0/-2   (Blank rows = not tested)   Passive ROM Right eval Rt 07/10/22  Knee flexion 120 122  Knee extension 2 2   (Blank rows = not tested)   LOWER EXTREMITY MMT:   MMT Right eval Left eval Right 07/17/22 Left/Right 08/02/22  Knee flexion 3/5  4/5    Knee extension 3/5 (9.1#) 4/5 (24.97#) 26.65# 45.0/31.9   (Blank rows = not tested)   FUNCTIONAL TESTING: 08/09/2022:  Lt SLS:  14 seconds    Rt SLS:   5 seconds c increased aberrant movement  GAIT: 06/28/22 Distance walked: 100' Assistive device utilized:  had brace on Rt knee Level of assistance: Modified independence Comments: antalgic gait on Rt, decreased stance Rt, decr hip/knee flexion on Rt with circumduction     TODAY'S TREATMENT: 08/09/2022 TherEx:  Nustep: L6 x 8 min LAQ  5# X 20 alternating bilat Standing hamstring curls 5# X 15 alternating bilat Seated knee flexion stretch AAROM 5 sec X15  Theractivity Leg Press 87# x15 bil push; single leg x15 bilateral , 37 lbs Step ups 6 inch X 15 fwd  with one UE support Step ups 6 inch X 15 lateral with one UE support   08/05/2022 TherEx:  Nustep: L6 x 8 min LAQ  5# X 20 alternating bilat Seated SLR 2X10 on Rt  Standing hamstring curls 5# X 15 alternating bilat Seated knee flexion stretch AAROM 5 sec X15  Theractivity Leg Press 87# 2x10 bil push; RLE only 37# 2x10 Step ups 6 inch X 10 fwd with one UE support Step ups 6 inch X 10 lateral with one UE support Sit to stand no UE support with slow eccentric lower X 10  08/02/22 Recumbent bike Seat 4 Level 5 for 8 minutes Seated straight leg raises 3 sets of 5 for 3 seconds (toes back, press  knee down, tighten thigh, pause 2 seconds, lift 4-6 inches, hold 3 seconds, slowly lower, relax)  Functional Activities for sit to stand and stairs: Sit to stand with slow eccentrics 5X Leg Press Double Leg slow eccentrics 87# 15X Single Leg Right only slow eccentrics 37# 10X Lateral step down off 4 inch step 10X Bil slow eccentrics   PATIENT EDUCATION:  Education details: HEP update Person educated: Patient Education method: Explanation, Demonstration, and Handouts Education comprehension: verbalized understanding, returned demonstration, and needs further education     HOME EXERCISE PROGRAM: Access Code: [email or web address] URL: [email or web address] Date: 08/09/2022 Prepared by: Scot Jun  Exercises - [place] Sitting Quad Set  - 2-3 x daily - 7 x weekly - 1 sets - 10 reps - 5 sec hold - Supine Heel Slide with Strap  - 2-3 x daily - 7 x weekly - 1 sets - 10 reps - 2-3 sec hold - Seated Knee Extension AROM  - 2-3 x daily - 7 x weekly - 1 sets - 10 reps - 5 sec hold hold - Sit to Stand  - 3 x daily - 7 x weekly - 1 sets - 10 reps - Seated Straight Leg Heel Taps  - 1-2 x daily - 7 x weekly - 3 sets - 10 reps    ASSESSMENT:   CLINICAL IMPRESSION: Continue plan for progressive strengthening to improve functional movement patterns.  Complaints of knee pain was reported with step down forward on Rt leg.  Otherwise good tolerance without pain reported during intervention today.  Continue with plan at this time.    OBJECTIVE IMPAIRMENTS Abnormal gait, decreased mobility, difficulty walking, decreased ROM, decreased strength, increased edema, and pain.    ACTIVITY LIMITATIONS carrying, lifting, bending, sitting, standing, squatting, stairs, transfers, bed mobility, locomotion level, and caring for others   PARTICIPATION LIMITATIONS: meal prep, cleaning, laundry, driving, shopping, community activity, and occupation   PERSONAL FACTORS 1-2 comorbidities: HTN, asthma  are also  affecting patient's functional outcome.    REHAB POTENTIAL: Good   CLINICAL DECISION MAKING: Stable/uncomplicated   EVALUATION COMPLEXITY: Low     GOALS: Goals reviewed with patient? Yes   SHORT TERM GOALS: Target date: 07/26/2022   Independent with initial HEP Goal status: Met 08/02/22   2.  Rt knee AROM improved 0-110 for improved mobility Goal status: Partially Met 08/02/2022     [place] TERM GOALS: Target date: 09/20/2022   Independent with final HEP Goal status: ongoing will need updates PRN   2.  FOTO score improved to 45 Goal status: Met 08/02/2022   3.  Rt knee AROM improved to 0-120 for improved function Goal status: Partially Met 08/02/2022   4.  Report pain < 2/10 with work simulated activities for improved function Goal status: On  Going 08/02/2022   5.  Demonstrate at least Rt knee ext strength of 25# in order to perform work related activities Goal status: MET 07/17/22, New Goal at least 50# 08/02/2022         PLAN: PT FREQUENCY: 1-2x/week (12 visits approved)   PT DURATION: 12 weeks   PLANNED INTERVENTIONS: Therapeutic exercises, Therapeutic activity, Neuromuscular re-education, Balance training, Gait training, Patient/Family education, Self Care, Joint mobilization, Stair training, Aquatic Therapy, Dry Needling, Electrical stimulation, Cryotherapy, Moist heat, Taping, Vasopneumatic device, Ultrasound, Ionotophoresis 34m/ml Dexamethasone, Manual therapy, and Re-evaluation   PLAN FOR NEXT SESSION:  Functional LE strengthening.    MScot Jun PT, DPT, OCS, ATC 08/09/22  8:39 AM

## 2022-08-13 ENCOUNTER — Ambulatory Visit (INDEPENDENT_AMBULATORY_CARE_PROVIDER_SITE_OTHER): Payer: Worker's Compensation | Admitting: Rehabilitative and Restorative Service Providers"

## 2022-08-13 ENCOUNTER — Encounter: Payer: Self-pay | Admitting: Rehabilitative and Restorative Service Providers"

## 2022-08-13 DIAGNOSIS — M25661 Stiffness of right knee, not elsewhere classified: Secondary | ICD-10-CM | POA: Diagnosis not present

## 2022-08-13 DIAGNOSIS — M25561 Pain in right knee: Secondary | ICD-10-CM | POA: Diagnosis not present

## 2022-08-13 DIAGNOSIS — R600 Localized edema: Secondary | ICD-10-CM

## 2022-08-13 DIAGNOSIS — R2689 Other abnormalities of gait and mobility: Secondary | ICD-10-CM

## 2022-08-13 DIAGNOSIS — M6281 Muscle weakness (generalized): Secondary | ICD-10-CM | POA: Diagnosis not present

## 2022-08-13 NOTE — Therapy (Signed)
OUTPATIENT PHYSICAL THERAPY TREATMENT   Patient Name: Jodi Hunt MRN: [id number] DOB:05-07-1966, 56 y.o., female Today's Date: 08/13/2022  PCP: none REFERRING PROVIDER: Elba Barman, DO     END OF SESSION:   PT End of Session - 08/13/22 0809     Visit Number 8    Number of Visits 12    Date for PT Re-Evaluation 09/20/22    Authorization Type WC approved 12 visits    PT Start Time 0803    PT Stop Time 0842    PT Time Calculation (min) 39 min    Activity Tolerance Patient tolerated treatment well    Behavior During Therapy [facility] for tasks assessed/performed                Past Medical History:  Diagnosis Date   Arthritis    Asthma    Hypertension    Past Surgical History:  Procedure Laterality Date   THYROIDECTOMY  1991   There are no problems to display for this patient.   REFERRING DIAG:  M25.561 (ICD-10-CM) - Right knee pain, unspecified chronicity  THERAPY DIAG:  Acute pain of right knee  Stiffness of right knee, not elsewhere classified  Muscle weakness (generalized)  Other abnormalities of gait and mobility  Localized edema  Rationale for Evaluation and Treatment Rehabilitation  PERTINENT HISTORY: hypertension and asthma   PRECAUTIONS: none  SUBJECTIVE: Pt indicated having up to 4/10 pain this morning.  Reported no specific changes in time since last visit.    PAIN:  NPRS scale: up to 4/10 Pain location: anterior Rt knee Pain description: achy Aggravating factors: morning sometimes, evening after activity.  Relieving factors: ice, pain meds.    OBJECTIVE: (objective measures completed at initial evaluation unless otherwise dated)  DIAGNOSTIC FINDINGS: U/S: Small degree of fluid/hyperemia suggestive of patellar contusion or aseptic prepatellar bursitis    PATIENT SURVEYS:  08/02/22: FOTO 59 (Met) 06/28/22: FOTO 26 (predicted 45)   EDEMA:  06/28/22: small pocket of fluid noted suprapatellar space, diffuse edema Rt knee but not  formally measured   PALPATION: 06/28/22: tenderness to light touch with patella mobility, medial joint line tenderness, pes anserine tenderness   LOWER EXTREMITY ROM:   Active ROM Right eval Left eval Rt 07/10/22 Left/Right 08/02/2022  Knee flexion 95 118 120 130/121  Knee extension -2 3  0/-2   (Blank rows = not tested)   Passive ROM Right eval Rt 07/10/22  Knee flexion 120 122  Knee extension 2 2   (Blank rows = not tested)   LOWER EXTREMITY MMT:   MMT Right eval Left eval Right 07/17/22 Left/Right 08/02/22  Knee flexion  3/5  4/5    Knee extension 3/5 (9.1#) 4/5 (24.97#) 26.65# 45.0/31.9   (Blank rows = not tested)   FUNCTIONAL TESTING: 08/09/2022:  Lt SLS:  14 seconds    Rt SLS:   5 seconds c increased aberrant movement  GAIT: 06/28/22 Distance walked: 100' Assistive device utilized:  had brace on Rt knee Level of assistance: Modified independence Comments: antalgic gait on Rt, decreased stance Rt, decr hip/knee flexion on Rt with circumduction     TODAY'S TREATMENT: 08/13/2022: Therex: UBE LE only lvl 3.0 RPM around 60 10 mins (seat 10) Leg press single leg 2 x 15, performed bilaterally 37 lbs Seated SLR c quad set 2 x 10 bilateral Sit to stand to sit 19 inch chair slow eccentrics x 10  (changed from 18 inch chair due to mild pain noted) Standing PF/DF alternating  x 15 each way   Neuro Re-ed SLS c black mat corner touching x 8 bilateral Tandem stance 1 min x 1 bilateral   08/09/2022 TherEx:  Nustep: L6 x 8 min LAQ  5# X 20 alternating bilat Standing hamstring curls 5# X 15 alternating bilat Seated knee flexion stretch AAROM 5 sec X15  Theractivity Leg Press 87# x15 bil push; single leg x15 bilateral , 37 lbs Step ups 6 inch X 15 fwd with one UE support Step ups 6 inch X 15 lateral with one UE support   08/05/2022 TherEx:  Nustep: L6 x 8 min LAQ  5# X 20 alternating bilat Seated SLR 2X10 on Rt  Standing hamstring curls 5# X 15 alternating  bilat Seated knee flexion stretch AAROM 5 sec X15  Theractivity Leg Press 87# 2x10 bil push; RLE only 37# 2x10 Step ups 6 inch X 10 fwd with one UE support Step ups 6 inch X 10 lateral with one UE support Sit to stand no UE support with slow eccentric lower X 10   PATIENT EDUCATION:  Education details: HEP update Person educated: Patient Education method: Explanation, Demonstration, and Handouts Education comprehension: verbalized understanding, returned demonstration, and needs further education     HOME EXERCISE PROGRAM: Access Code: [email or web address] URL: [email or web address] Date: 08/09/2022 Prepared by: Scot Jun  Exercises - Bame Sitting Quad Set  - 2-3 x daily - 7 x weekly - 1 sets - 10 reps - 5 sec hold - Supine Heel Slide with Strap  - 2-3 x daily - 7 x weekly - 1 sets - 10 reps - 2-3 sec hold - Seated Knee Extension AROM  - 2-3 x daily - 7 x weekly - 1 sets - 10 reps - 5 sec hold hold - Sit to Stand  - 3 x daily - 7 x weekly - 1 sets - 10 reps - Seated Straight Leg Heel Taps  - 1-2 x daily - 7 x weekly - 3 sets - 10 reps    ASSESSMENT:   CLINICAL IMPRESSION: Fair balance control at best noted in various performance interventions today, requiring stepping or hand assist on counter to prevent loss of balance.    OBJECTIVE IMPAIRMENTS Abnormal gait, decreased mobility, difficulty walking, decreased ROM, decreased strength, increased edema, and pain.    ACTIVITY LIMITATIONS carrying, lifting, bending, sitting, standing, squatting, stairs, transfers, bed mobility, locomotion level, and caring for others   PARTICIPATION LIMITATIONS: meal prep, cleaning, laundry, driving, shopping, community activity, and occupation   PERSONAL FACTORS 1-2 comorbidities: HTN, asthma  are also affecting patient's functional outcome.    REHAB POTENTIAL: Good   CLINICAL DECISION MAKING: Stable/uncomplicated   EVALUATION COMPLEXITY: Low     GOALS: Goals reviewed with  patient? Yes   SHORT TERM GOALS: Target date: 07/26/2022   Independent with initial HEP Goal status: Met 08/02/22   2.  Rt knee AROM improved 0-110 for improved mobility Goal status: Partially Met 08/02/2022     [place] TERM GOALS: Target date: 09/20/2022   Independent with final HEP Goal status: ongoing will need updates PRN   2.  FOTO score improved to 45 Goal status: Met 08/02/2022   3.  Rt knee AROM improved to 0-120 for improved function Goal status: Partially Met 08/02/2022   4.  Report pain < 2/10 with work simulated activities for improved function Goal status: On Going 08/02/2022   5.  Demonstrate at least Rt knee ext strength of 25# in order to perform work related activities Goal  status: MET 07/17/22, New Goal at least 50# 08/02/2022         PLAN: PT FREQUENCY: 1-2x/week (12 visits approved)   PT DURATION: 12 weeks   PLANNED INTERVENTIONS: Therapeutic exercises, Therapeutic activity, Neuromuscular re-education, Balance training, Gait training, Patient/Family education, Self Care, Joint mobilization, Stair training, Aquatic Therapy, Dry Needling, Electrical stimulation, Cryotherapy, Moist heat, Taping, Vasopneumatic device, Ultrasound, Ionotophoresis 4mg /ml Dexamethasone, Manual therapy, and Re-evaluation   PLAN FOR NEXT SESSION:  Objective data gathering for weekly assessment.  Strengthening progression as tolerated.    Scot Jun, PT, DPT, OCS, ATC 08/13/22  8:39 AM

## 2022-08-14 ENCOUNTER — Encounter: Payer: Self-pay | Admitting: Physical Therapy

## 2022-08-15 ENCOUNTER — Ambulatory Visit (INDEPENDENT_AMBULATORY_CARE_PROVIDER_SITE_OTHER): Payer: Worker's Compensation | Admitting: Physical Therapy

## 2022-08-15 ENCOUNTER — Encounter: Payer: Self-pay | Admitting: Physical Therapy

## 2022-08-15 DIAGNOSIS — R600 Localized edema: Secondary | ICD-10-CM

## 2022-08-15 DIAGNOSIS — M6281 Muscle weakness (generalized): Secondary | ICD-10-CM | POA: Diagnosis not present

## 2022-08-15 DIAGNOSIS — M25661 Stiffness of right knee, not elsewhere classified: Secondary | ICD-10-CM

## 2022-08-15 DIAGNOSIS — R2689 Other abnormalities of gait and mobility: Secondary | ICD-10-CM

## 2022-08-15 DIAGNOSIS — M25561 Pain in right knee: Secondary | ICD-10-CM

## 2022-08-15 NOTE — Therapy (Signed)
OUTPATIENT PHYSICAL THERAPY TREATMENT   Patient Name: Jodi Hunt MRN: [id number] DOB:Mar 19, 1966, 56 y.o., female Today's Date: 08/15/2022  PCP: none REFERRING PROVIDER: Elba Barman, DO     END OF SESSION:   PT End of Session - 08/15/22 0846     Visit Number 9    Number of Visits 12    Date for PT Re-Evaluation 09/20/22    Authorization Type WC approved 12 visits    Authorization - Visit Number 9    Authorization - Number of Visits 12    PT Start Time [id number]    PT Stop Time 0930    PT Time Calculation (min) 44 min    Activity Tolerance Patient tolerated treatment well    Behavior During Therapy WFL for tasks assessed/performed                 Past Medical History:  Diagnosis Date   Arthritis    Asthma    Hypertension    Past Surgical History:  Procedure Laterality Date   THYROIDECTOMY  1991   There are no problems to display for this patient.   REFERRING DIAG:  M25.561 (ICD-10-CM) - Right knee pain, unspecified chronicity  THERAPY DIAG:  Acute pain of right knee  Stiffness of right knee, not elsewhere classified  Muscle weakness (generalized)  Other abnormalities of gait and mobility  Localized edema  Rationale for Evaluation and Treatment Rehabilitation  PERTINENT HISTORY: hypertension and asthma   PRECAUTIONS: none  SUBJECTIVE: Things are about the same since last PT   PAIN:  NPRS scale:this morning 0/10 with medications & limited activity with time of day & over last week up to 6-7/10 Pain location: anterior Rt knee Pain description: achy Aggravating factors: morning sometimes, evening after activity.  Relieving factors: ice, pain meds.    OBJECTIVE: (objective measures completed at initial evaluation unless otherwise dated)  DIAGNOSTIC FINDINGS: U/S: Small degree of fluid/hyperemia suggestive of patellar contusion or aseptic prepatellar bursitis    PATIENT SURVEYS:  08/02/22: FOTO 59 (Met) 06/28/22: FOTO 26 (predicted 45)    EDEMA:  06/28/22: small pocket of fluid noted suprapatellar space, diffuse edema Rt knee but not formally measured   PALPATION: 06/28/22: tenderness to light touch with patella mobility, medial joint line tenderness, pes anserine tenderness   LOWER EXTREMITY ROM:   Active ROM Right eval Left eval Rt 07/10/22 Left/Right 08/02/2022 Left/Right 08/15/22  Knee flexion 95 118 120 130/121 130/ 122  Knee extension -2 3  0/-2 0/-2   (Blank rows = not tested)   Passive ROM Right eval Rt 07/10/22  Knee flexion 120 122  Knee extension 2 2   (Blank rows = not tested)   LOWER EXTREMITY MMT:   MMT Right eval Left eval Right 07/17/22 Left/Right 08/02/22  Knee flexion  3/5  4/5    Knee extension 3/5 (9.1#) 4/5 (24.97#) 26.65# 45.0/31.9   (Blank rows = not tested)   FUNCTIONAL TESTING: 08/09/2022:  Lt SLS:  14 seconds    Rt SLS:   5 seconds c increased aberrant movement  GAIT: 06/28/22 Distance walked: 100' Assistive device utilized:  had brace on Rt knee Level of assistance: Modified independence Comments: antalgic gait on Rt, decreased stance Rt, decr hip/knee flexion on Rt with circumduction     TODAY'S TREATMENT: 08/15/2022: Therex: SciFit bike LE only lvl 3.0 RPM around 60 10 mins (seat 10) Leg press single leg 2 x 15, performed bilaterally 37 lbs (tried 43# but unable to tolerate) Seated SLR  c quad set 2 x 10 bilateral Standing on foam PF/DF alternating x 15 each way Gastroc stretch on step heel depression 30 sec 2 reps ea LE Standing quad stretch knee flexion with towel 30 sec 2 reps ea LE Seated hamstring stretch with strap DF 30 sec hold 2 reps ea LE  PT updated HEP to include above stretches including HO. Pt verbalized & return demo understanding.   Neuro Re-ed SLS on foam with contralateral LE touching 4 corners x 8 bilateral Tandem stance on foam with intermittent UE touch 30 sec x 2 bilateral   08/13/2022: Therex: UBE LE only lvl 3.0 RPM around 60 10 mins (seat  10) Leg press single leg 2 x 15, performed bilaterally 37 lbs Seated SLR c quad set 2 x 10 bilateral Sit to stand to sit 19 inch chair slow eccentrics x 10  (changed from 18 inch chair due to mild pain noted) Standing PF/DF alternating x 15 each way   Neuro Re-ed SLS c black mat corner touching x 8 bilateral Tandem stance 1 min x 1 bilateral   08/09/2022 TherEx:  Nustep: L6 x 8 min LAQ  5# X 20 alternating bilat Standing hamstring curls 5# X 15 alternating bilat Seated knee flexion stretch AAROM 5 sec X15  Theractivity Leg Press 87# x15 bil push; single leg x15 bilateral , 37 lbs Step ups 6 inch X 15 fwd with one UE support Step ups 6 inch X 15 lateral with one UE support    PATIENT EDUCATION:  Education details: HEP update Person educated: Patient Education method: Explanation, Demonstration, and Handouts Education comprehension: verbalized understanding, returned demonstration, and needs further education     HOME EXERCISE PROGRAM: Access Code: [email or web address] URL: [email or web address] Date: 08/15/2022 Prepared by: Jamey Reas  Exercises - [place] Sitting Quad Set  - 2-3 x daily - 7 x weekly - 1 sets - 10 reps - 5 sec hold - Supine Heel Slide with Strap  - 2-3 x daily - 7 x weekly - 1 sets - 10 reps - 2-3 sec hold - Seated Knee Extension AROM  - 2-3 x daily - 7 x weekly - 1 sets - 10 reps - 5 sec hold hold - Sit to Stand  - 3 x daily - 7 x weekly - 1 sets - 10 reps - Seated Straight Leg Heel Taps  - 1-2 x daily - 7 x weekly - 3 sets - 10 reps - Gastroc Stretch on Step  - 1-2 x daily - 7 x weekly - 1 sets - 2-3 reps - 20-30 seconds hold - Standing Quad Stretch with Strap  - 1-2 x daily - 7 x weekly - 1 sets - 2-3 reps - 20-30 seconds hold - Seated Hamstring Stretch with Strap  - 1-2 x daily - 7 x weekly - 1 sets - 2-3 reps - 20-30 seconds hold    ASSESSMENT:   CLINICAL IMPRESSION: PT added stretches to major muscles around knee and she appears to  understand them.  She was challenged by compliant surface balance.  Patient continues to be limited by knee pain for standing activities and reports increases after being on her feet at work all day.    OBJECTIVE IMPAIRMENTS Abnormal gait, decreased mobility, difficulty walking, decreased ROM, decreased strength, increased edema, and pain.    ACTIVITY LIMITATIONS carrying, lifting, bending, sitting, standing, squatting, stairs, transfers, bed mobility, locomotion level, and caring for others   PARTICIPATION LIMITATIONS: meal prep, cleaning, laundry, driving, shopping, community activity,  and occupation   PERSONAL FACTORS 1-2 comorbidities: HTN, asthma  are also affecting patient's functional outcome.    REHAB POTENTIAL: Good   CLINICAL DECISION MAKING: Stable/uncomplicated   EVALUATION COMPLEXITY: Low     GOALS: Goals reviewed with patient? Yes   SHORT TERM GOALS: Target date: 07/26/2022   Independent with initial HEP Goal status: Met 08/02/22   2.  Rt knee AROM improved 0-110 for improved mobility Goal status: Partially Met 08/02/2022     [place] TERM GOALS: Target date: 09/20/2022   Independent with final HEP Goal status: ongoing will need updates PRN   2.  FOTO score improved to 45 Goal status: Met 08/02/2022   3.  Rt knee AROM improved to 0-120 for improved function Goal status: Partially Met 08/02/2022   4.  Report pain < 2/10 with work simulated activities for improved function Goal status: On Going 08/02/2022   5.  Demonstrate at least Rt knee ext strength of 25# in order to perform work related activities Goal status: MET 07/17/22, New Goal at least 50# 08/02/2022         PLAN: PT FREQUENCY: 1-2x/week (12 visits approved)   PT DURATION: 12 weeks   PLANNED INTERVENTIONS: Therapeutic exercises, Therapeutic activity, Neuromuscular re-education, Balance training, Gait training, Patient/Family education, Self Care, Joint mobilization, Stair training, Aquatic Therapy, Dry  Needling, Electrical stimulation, Cryotherapy, Moist heat, Taping, Vasopneumatic device, Ultrasound, Ionotophoresis 4mg /ml Dexamethasone, Manual therapy, and Re-evaluation   PLAN FOR NEXT SESSION:  check if stretches added to HEP made any difference in pain,  with low frequency continue to educate on HEP progression & mechanics for activities.  Strengthening progression as tolerated.    Jamey Reas, PT, DPT 08/15/2022, 11:34 AM

## 2022-08-16 ENCOUNTER — Encounter: Payer: Self-pay | Admitting: Physical Therapy

## 2022-08-19 ENCOUNTER — Ambulatory Visit (INDEPENDENT_AMBULATORY_CARE_PROVIDER_SITE_OTHER): Payer: Worker's Compensation | Admitting: Rehabilitative and Restorative Service Providers"

## 2022-08-19 ENCOUNTER — Encounter: Payer: Self-pay | Admitting: Rehabilitative and Restorative Service Providers"

## 2022-08-19 DIAGNOSIS — M25661 Stiffness of right knee, not elsewhere classified: Secondary | ICD-10-CM

## 2022-08-19 DIAGNOSIS — R2689 Other abnormalities of gait and mobility: Secondary | ICD-10-CM

## 2022-08-19 DIAGNOSIS — R600 Localized edema: Secondary | ICD-10-CM

## 2022-08-19 DIAGNOSIS — M25561 Pain in right knee: Secondary | ICD-10-CM | POA: Diagnosis not present

## 2022-08-19 DIAGNOSIS — M6281 Muscle weakness (generalized): Secondary | ICD-10-CM

## 2022-08-19 NOTE — Therapy (Signed)
OUTPATIENT PHYSICAL THERAPY TREATMENT   Patient Name: Jodi Hunt MRN: [id number] DOB:1966/08/02, 56 y.o., female Today's Date: 08/19/2022  PCP: none REFERRING PROVIDER: Elba Barman, DO     END OF SESSION:   PT End of Session - 08/19/22 0804     Visit Number 10    Number of Visits 12    Date for PT Re-Evaluation 09/20/22    Authorization Type WC approved 12 visits    Authorization - Visit Number 10    Authorization - Number of Visits 12    PT Start Time 0800    PT Stop Time 0840    PT Time Calculation (min) 40 min    Activity Tolerance Patient tolerated treatment well    Behavior During Therapy WFL for tasks assessed/performed                  Past Medical History:  Diagnosis Date   Arthritis    Asthma    Hypertension    Past Surgical History:  Procedure Laterality Date   THYROIDECTOMY  1991   There are no problems to display for this patient.   REFERRING DIAG:  M25.561 (ICD-10-CM) - Right knee pain, unspecified chronicity  THERAPY DIAG:  Acute pain of right knee  Stiffness of right knee, not elsewhere classified  Muscle weakness (generalized)  Other abnormalities of gait and mobility  Localized edema  Rationale for Evaluation and Treatment Rehabilitation  PERTINENT HISTORY: hypertension and asthma   PRECAUTIONS: none  SUBJECTIVE: Pt indicated 4 or so at worst at times over the weekend.  No pain complaints indicated upon arrival today.   Pt indicated still having some pain but able to get through things during the day and then rests afterward.    PAIN:  NPRS scale:  up to 4/10 Pain location: anterior Rt knee Pain description: achy Aggravating factors: morning sometimes, evening after activity.  Relieving factors: ice, pain meds.    OBJECTIVE: (objective measures completed at initial evaluation unless otherwise dated)  DIAGNOSTIC FINDINGS: U/S: Small degree of fluid/hyperemia suggestive of patellar contusion or aseptic  prepatellar bursitis    PATIENT SURVEYS:  08/02/22: FOTO 59 (Met) 06/28/22: FOTO 26 (predicted 45)   EDEMA:  06/28/22: small pocket of fluid noted suprapatellar space, diffuse edema Rt knee but not formally measured   PALPATION: 06/28/22: tenderness to light touch with patella mobility, medial joint line tenderness, pes anserine tenderness   LOWER EXTREMITY ROM:   Active ROM Right eval Left eval Rt 07/10/22 Left/Right 08/02/2022 Left/Right 08/15/22  Knee flexion 95 118 120 130/121 130/ 122  Knee extension -2 3  0/-2 0/-2   (Blank rows = not tested)   Passive ROM Right eval Rt 07/10/22  Knee flexion 120 122  Knee extension 2 2   (Blank rows = not tested)   LOWER EXTREMITY MMT:   MMT Right eval Left eval Right 07/17/22 Left/Right 08/02/22  Knee flexion  3/5  4/5    Knee extension 3/5 (9.1#) 4/5 (24.97#) 26.65# 45.0/31.9   (Blank rows = not tested)   FUNCTIONAL TESTING: 08/09/2022:  Lt SLS:  14 seconds    Rt SLS:   5 seconds c increased aberrant movement  GAIT: 06/28/22 Distance walked: 100' Assistive device utilized:  had brace on Rt knee Level of assistance: Modified independence Comments: antalgic gait on Rt, decreased stance Rt, decr hip/knee flexion on Rt with circumduction     TODAY'S TREATMENT: 08/19/2022: Therex: SciFit bike LE only lvl 3.5 RPM around 60 8 mins (  seat 10) Lateral step down eccentric control focus 4 inch 2 x 10 bilateral (6 inch step caused Rt knee pain) 23 inch table height squat to touch and stand x 15 Seated SLR c quad set 2 x 10 bilateral 2 sec hold 1.5 lbs  Machine knee extension double leg up, single leg eccentric lowering (extension limited from end range to avoid pain) 10 lb 2 x 10 bilateral Incline gastroc stretch 30 sec x 3 bilateral  Neuro Re-ed SLS c contralateral leg cone touching 3 cones x 6 each bilateral  08/15/2022: Therex: SciFit bike LE only lvl 3.0 RPM around 60 10 mins (seat 10) Leg press single leg 2 x 15, performed  bilaterally 37 lbs (tried 43# but unable to tolerate) Seated SLR c quad set 2 x 10 bilateral Standing on foam PF/DF alternating x 15 each way Gastroc stretch on step heel depression 30 sec 2 reps ea LE Standing quad stretch knee flexion with towel 30 sec 2 reps ea LE Seated hamstring stretch with strap DF 30 sec hold 2 reps ea LE  PT updated HEP to include above stretches including HO. Pt verbalized & return demo understanding.   Neuro Re-ed SLS on foam with contralateral LE touching 4 corners x 8 bilateral Tandem stance on foam with intermittent UE touch 30 sec x 2 bilateral   08/13/2022: Therex: UBE LE only lvl 3.0 RPM around 60 10 mins (seat 10) Leg press single leg 2 x 15, performed bilaterally 37 lbs Seated SLR c quad set 2 x 10 bilateral Sit to stand to sit 19 inch chair slow eccentrics x 10  (changed from 18 inch chair due to mild pain noted) Standing PF/DF alternating x 15 each way   Neuro Re-ed SLS c black mat corner touching x 8 bilateral Tandem stance 1 min x 1 bilateral   PATIENT EDUCATION:  Education details: HEP update Person educated: Patient Education method: Explanation, Demonstration, and Handouts Education comprehension: verbalized understanding, returned demonstration, and needs further education     HOME EXERCISE PROGRAM: Access Code: [email or web address] URL: [email or web address] Date: 08/15/2022 Prepared by: Jamey Reas  Exercises - [place] Sitting Quad Set  - 2-3 x daily - 7 x weekly - 1 sets - 10 reps - 5 sec hold - Supine Heel Slide with Strap  - 2-3 x daily - 7 x weekly - 1 sets - 10 reps - 2-3 sec hold - Seated Knee Extension AROM  - 2-3 x daily - 7 x weekly - 1 sets - 10 reps - 5 sec hold hold - Sit to Stand  - 3 x daily - 7 x weekly - 1 sets - 10 reps - Seated Straight Leg Heel Taps  - 1-2 x daily - 7 x weekly - 3 sets - 10 reps - Gastroc Stretch on Step  - 1-2 x daily - 7 x weekly - 1 sets - 2-3 reps - 20-30 seconds hold - Standing Quad  Stretch with Strap  - 1-2 x daily - 7 x weekly - 1 sets - 2-3 reps - 20-30 seconds hold - Seated Hamstring Stretch with Strap  - 1-2 x daily - 7 x weekly - 1 sets - 2-3 reps - 20-30 seconds hold    ASSESSMENT:   CLINICAL IMPRESSION: Loading WB activity with increased flexion produced knee pain in both knees(noted with step down at higher step height as well as in deeper squat movement to chair).  Continued to adjust intervention for strengthening to avoid pain increase but  address strength deficits for improved functional control.    OBJECTIVE IMPAIRMENTS Abnormal gait, decreased mobility, difficulty walking, decreased ROM, decreased strength, increased edema, and pain.    ACTIVITY LIMITATIONS carrying, lifting, bending, sitting, standing, squatting, stairs, transfers, bed mobility, locomotion level, and caring for others   PARTICIPATION LIMITATIONS: meal prep, cleaning, laundry, driving, shopping, community activity, and occupation   PERSONAL FACTORS 1-2 comorbidities: HTN, asthma  are also affecting patient's functional outcome.    REHAB POTENTIAL: Good   CLINICAL DECISION MAKING: Stable/uncomplicated   EVALUATION COMPLEXITY: Low     GOALS: Goals reviewed with patient? Yes   SHORT TERM GOALS: Target date: 07/26/2022   Independent with initial HEP Goal status: Met 08/02/22   2.  Rt knee AROM improved 0-110 for improved mobility Goal status: Partially Met 08/02/2022     [place] TERM GOALS: Target date: 09/20/2022   Independent with final HEP Goal status: ongoing will need updates PRN   2.  FOTO score improved to 45 Goal status: Met 08/02/2022   3.  Rt knee AROM improved to 0-120 for improved function Goal status: Partially Met 08/02/2022   4.  Report pain < 2/10 with work simulated activities for improved function Goal status: On Going 08/02/2022   5.  Demonstrate at least Rt knee ext strength of 25# in order to perform work related activities Goal status: MET 07/17/22, New  Goal at least 50# 08/02/2022         PLAN: PT FREQUENCY: 1-2x/week (12 visits approved)   PT DURATION: 12 weeks   PLANNED INTERVENTIONS: Therapeutic exercises, Therapeutic activity, Neuromuscular re-education, Balance training, Gait training, Patient/Family education, Self Care, Joint mobilization, Stair training, Aquatic Therapy, Dry Needling, Electrical stimulation, Cryotherapy, Moist heat, Taping, Vasopneumatic device, Ultrasound, Ionotophoresis 80m/ml Dexamethasone, Manual therapy, and Re-evaluation   PLAN FOR NEXT SESSION:  2 more approved visits remain at this time.  Continue progressive strengthening and balance improvements.    MScot Jun PT, DPT, OCS, ATC 08/19/22  8:42 AM

## 2022-08-21 ENCOUNTER — Encounter: Payer: Self-pay | Admitting: Physical Therapy

## 2022-08-21 ENCOUNTER — Ambulatory Visit (INDEPENDENT_AMBULATORY_CARE_PROVIDER_SITE_OTHER): Payer: Worker's Compensation | Admitting: Rehabilitative and Restorative Service Providers"

## 2022-08-21 ENCOUNTER — Encounter: Payer: Self-pay | Admitting: Rehabilitative and Restorative Service Providers"

## 2022-08-21 DIAGNOSIS — M25561 Pain in right knee: Secondary | ICD-10-CM

## 2022-08-21 DIAGNOSIS — R2689 Other abnormalities of gait and mobility: Secondary | ICD-10-CM | POA: Diagnosis not present

## 2022-08-21 DIAGNOSIS — M25661 Stiffness of right knee, not elsewhere classified: Secondary | ICD-10-CM | POA: Diagnosis not present

## 2022-08-21 DIAGNOSIS — R600 Localized edema: Secondary | ICD-10-CM

## 2022-08-21 DIAGNOSIS — M6281 Muscle weakness (generalized): Secondary | ICD-10-CM | POA: Diagnosis not present

## 2022-08-21 NOTE — Therapy (Signed)
OUTPATIENT PHYSICAL THERAPY TREATMENT   Patient Name: Jodi Hunt MRN: [id number] DOB:1966/04/08, 56 y.o., female Today's Date: 08/21/2022  PCP: none REFERRING PROVIDER: Elba Barman, DO     END OF SESSION:   PT End of Session - 08/21/22 0808     Visit Number 11    Number of Visits 12    Date for PT Re-Evaluation 09/20/22    Authorization Type WC approved 12 visits    Authorization - Visit Number 11    Authorization - Number of Visits 12    PT Start Time 0801    PT Stop Time 0840    PT Time Calculation (min) 39 min    Activity Tolerance Patient tolerated treatment well    Behavior During Therapy WFL for tasks assessed/performed                   Past Medical History:  Diagnosis Date   Arthritis    Asthma    Hypertension    Past Surgical History:  Procedure Laterality Date   THYROIDECTOMY  1991   There are no problems to display for this patient.   REFERRING DIAG:  M25.561 (ICD-10-CM) - Right knee pain, unspecified chronicity  THERAPY DIAG:  Acute pain of right knee  Stiffness of right knee, not elsewhere classified  Muscle weakness (generalized)  Other abnormalities of gait and mobility  Localized edema  Rationale for Evaluation and Treatment Rehabilitation  PERTINENT HISTORY: hypertension and asthma   PRECAUTIONS: none  SUBJECTIVE: Pt reported no pain upon arrival today, reported pain at most in last day or two at 5/10.     PAIN:  NPRS scale:  up to 5/10 Pain location: anterior Rt knee Pain description: achy Aggravating factors: morning sometimes, evening after activity.  Relieving factors: ice, pain meds.    OBJECTIVE: (objective measures completed at initial evaluation unless otherwise dated)  DIAGNOSTIC FINDINGS: U/S: Small degree of fluid/hyperemia suggestive of patellar contusion or aseptic prepatellar bursitis    PATIENT SURVEYS:  08/02/22: FOTO 59 (Met) 06/28/22: FOTO 26 (predicted 45)   EDEMA:  06/28/22: small  pocket of fluid noted suprapatellar space, diffuse edema Rt knee but not formally measured   PALPATION: 06/28/22: tenderness to light touch with patella mobility, medial joint line tenderness, pes anserine tenderness   LOWER EXTREMITY ROM:   Active ROM Right eval Left eval Rt 07/10/22 Left/Right 08/02/2022 Left/Right 08/15/22  Knee flexion 95 118 120 130/121 130/ 122  Knee extension -2 3  0/-2 0/-2   (Blank rows = not tested)   Passive ROM Right eval Rt 07/10/22  Knee flexion 120 122  Knee extension 2 2   (Blank rows = not tested)   LOWER EXTREMITY MMT:   MMT Right eval Left eval Right 07/17/22 Left/Right 08/02/22  Knee flexion  3/5  4/5    Knee extension 3/5 (9.1#) 4/5 (24.97#) 26.65# 45.0/31.9   (Blank rows = not tested)   FUNCTIONAL TESTING: 08/09/2022:  Lt SLS:  14 seconds    Rt SLS:   5 seconds c increased aberrant movement  GAIT: 06/28/22 Distance walked: 100' Assistive device utilized:  had brace on Rt knee Level of assistance: Modified independence Comments: antalgic gait on Rt, decreased stance Rt, decr hip/knee flexion on Rt with circumduction     TODAY'S TREATMENT: 08/21/2022: Therex: Recumbent bike lvl 4 8 mins, seat 4 Step up fwd 6 inch c TKE blue band Rt leg WB 2 x 10  Leg press Rt leg only 37 lbs 2  x 15 slow lowering focus Machine knee extension double leg up, single leg eccentric lowering (extension limited from end range to avoid pain) 10 lb 2 x 10 bilateral Standing lateral stepping green band 10 ft x 5 each way in partial squat hold  Neuro Re-ed SLS on black mat with corner touching contralateral leg x 6 each, performed bilaterally Tandem ambulation fwd/back 10 ft x 5 each way  08/19/2022: Therex: SciFit bike LE only lvl 3.5 RPM around 60 8 mins (seat 10) Lateral step down eccentric control focus 4 inch 2 x 10 bilateral (6 inch step caused Rt knee pain) 23 inch table height squat to touch and stand x 15 Seated SLR c quad set 2 x 10 bilateral 2  sec hold 1.5 lbs  Machine knee extension double leg up, single leg eccentric lowering (extension limited from end range to avoid pain) 10 lb 2 x 10 bilateral Incline gastroc stretch 30 sec x 3 bilateral  Neuro Re-ed SLS c contralateral leg cone touching 3 cones x 6 each bilateral  08/15/2022: Therex: SciFit bike LE only lvl 3.0 RPM around 60 10 mins (seat 10) Leg press single leg 2 x 15, performed bilaterally 37 lbs (tried 43# but unable to tolerate) Seated SLR c quad set 2 x 10 bilateral Standing on foam PF/DF alternating x 15 each way Gastroc stretch on step heel depression 30 sec 2 reps ea LE Standing quad stretch knee flexion with towel 30 sec 2 reps ea LE Seated hamstring stretch with strap DF 30 sec hold 2 reps ea LE  PT updated HEP to include above stretches including HO. Pt verbalized & return demo understanding.   Neuro Re-ed SLS on foam with contralateral LE touching 4 corners x 8 bilateral Tandem stance on foam with intermittent UE touch 30 sec x 2 bilateral  PATIENT EDUCATION:  Education details: HEP update Person educated: Patient Education method: Explanation, Demonstration, and Handouts Education comprehension: verbalized understanding, returned demonstration, and needs further education     HOME EXERCISE PROGRAM: Access Code: [email or web address] URL: [email or web address] Date: 08/15/2022 Prepared by: Jamey Reas  Exercises - [place] Sitting Quad Set  - 2-3 x daily - 7 x weekly - 1 sets - 10 reps - 5 sec hold - Supine Heel Slide with Strap  - 2-3 x daily - 7 x weekly - 1 sets - 10 reps - 2-3 sec hold - Seated Knee Extension AROM  - 2-3 x daily - 7 x weekly - 1 sets - 10 reps - 5 sec hold hold - Sit to Stand  - 3 x daily - 7 x weekly - 1 sets - 10 reps - Seated Straight Leg Heel Taps  - 1-2 x daily - 7 x weekly - 3 sets - 10 reps - Gastroc Stretch on Step  - 1-2 x daily - 7 x weekly - 1 sets - 2-3 reps - 20-30 seconds hold - Standing Quad Stretch with Strap   - 1-2 x daily - 7 x weekly - 1 sets - 2-3 reps - 20-30 seconds hold - Seated Hamstring Stretch with Strap  - 1-2 x daily - 7 x weekly - 1 sets - 2-3 reps - 20-30 seconds hold    ASSESSMENT:   CLINICAL IMPRESSION: Pt has one more approved visit remaining at this time.  Continued complaints of knee pain reported in time during the day with functional activity including standing, walking, squatting.  Plan to continue to address strength deficits in last approved visit and make  any skilled PT recommendations at that time.    OBJECTIVE IMPAIRMENTS Abnormal gait, decreased mobility, difficulty walking, decreased ROM, decreased strength, increased edema, and pain.    ACTIVITY LIMITATIONS carrying, lifting, bending, sitting, standing, squatting, stairs, transfers, bed mobility, locomotion level, and caring for others   PARTICIPATION LIMITATIONS: meal prep, cleaning, laundry, driving, shopping, community activity, and occupation   PERSONAL FACTORS 1-2 comorbidities: HTN, asthma  are also affecting patient's functional outcome.    REHAB POTENTIAL: Good   CLINICAL DECISION MAKING: Stable/uncomplicated   EVALUATION COMPLEXITY: Low     GOALS: Goals reviewed with patient? Yes   SHORT TERM GOALS: Target date: 07/26/2022   Independent with initial HEP Goal status: Met 08/02/22   2.  Rt knee AROM improved 0-110 for improved mobility Goal status: Partially Met 08/02/2022     [place] TERM GOALS: Target date: 09/20/2022   Independent with final HEP Goal status: ongoing will need updates PRN   2.  FOTO score improved to 45 Goal status: Met 08/02/2022   3.  Rt knee AROM improved to 0-120 for improved function Goal status: Partially Met 08/02/2022   4.  Report pain < 2/10 with work simulated activities for improved function Goal status: On Going 08/02/2022   5.  Demonstrate at least Rt knee ext strength of 25# in order to perform work related activities Goal status: MET 07/17/22, New Goal at least  50# 08/02/2022         PLAN: PT FREQUENCY: 1-2x/week (12 visits approved)   PT DURATION: 12 weeks   PLANNED INTERVENTIONS: Therapeutic exercises, Therapeutic activity, Neuromuscular re-education, Balance training, Gait training, Patient/Family education, Self Care, Joint mobilization, Stair training, Aquatic Therapy, Dry Needling, Electrical stimulation, Cryotherapy, Moist heat, Taping, Vasopneumatic device, Ultrasound, Ionotophoresis 4mg /ml Dexamethasone, Manual therapy, and Re-evaluation   PLAN FOR NEXT SESSION: 1 more approved visit.  Measure information for return to MD office and any recommendations for continued skilled PT services.   Scot Jun, PT, DPT, OCS, ATC 08/21/22  8:40 AM

## 2022-08-23 ENCOUNTER — Encounter: Payer: Self-pay | Admitting: Physical Therapy

## 2022-08-26 ENCOUNTER — Ambulatory Visit (INDEPENDENT_AMBULATORY_CARE_PROVIDER_SITE_OTHER): Payer: Worker's Compensation | Admitting: Rehabilitative and Restorative Service Providers"

## 2022-08-26 ENCOUNTER — Encounter: Payer: Self-pay | Admitting: Rehabilitative and Restorative Service Providers"

## 2022-08-26 DIAGNOSIS — M6281 Muscle weakness (generalized): Secondary | ICD-10-CM | POA: Diagnosis not present

## 2022-08-26 DIAGNOSIS — R2689 Other abnormalities of gait and mobility: Secondary | ICD-10-CM | POA: Diagnosis not present

## 2022-08-26 DIAGNOSIS — M25661 Stiffness of right knee, not elsewhere classified: Secondary | ICD-10-CM

## 2022-08-26 DIAGNOSIS — M25561 Pain in right knee: Secondary | ICD-10-CM

## 2022-08-26 DIAGNOSIS — R600 Localized edema: Secondary | ICD-10-CM

## 2022-08-26 NOTE — Therapy (Addendum)
OUTPATIENT PHYSICAL THERAPY TREATMENT /PROGRESS NOTE /DISCHARGE   Patient Name: Jodi Hunt MRN: [id number] DOB:[birth year], 56 y.o., female Today's Date: 08/26/2022  PCP: none REFERRING PROVIDER: Elba Barman, DO   Progress Note Reporting Period 08/05/2022 to 08/26/2022  See note below for Objective Data and Assessment of Progress/Goals.        END OF SESSION:   PT End of Session - 08/26/22 0827     Visit Number 12    Number of Visits 12    Date for PT Re-Evaluation 09/20/22    Authorization Type WC approved 12 visits    Authorization - Visit Number 12    Authorization - Number of Visits 12    PT Start Time 0813    PT Stop Time 0840    PT Time Calculation (min) 27 min    Activity Tolerance Patient tolerated treatment well    Behavior During Therapy WFL for tasks assessed/performed                    Past Medical History:  Diagnosis Date   Arthritis    Asthma    Hypertension    Past Surgical History:  Procedure Laterality Date   THYROIDECTOMY  1991   There are no problems to display for this patient.   REFERRING DIAG:  M25.561 (ICD-10-CM) - Right knee pain, unspecified chronicity  THERAPY DIAG:  Acute pain of right knee  Stiffness of right knee, not elsewhere classified  Muscle weakness (generalized)  Other abnormalities of gait and mobility  Localized edema  Rationale for Evaluation and Treatment Rehabilitation  PERTINENT HISTORY: hypertension and asthma   PRECAUTIONS: none  SUBJECTIVE: Pt indicated concern/troubles with prolonged standing/walking required for work activity.  Pt indicated overall improvement to normal around 75%.    PAIN:  NPRS scale:  current:  1/10.   At worst over weekend 4-5/10 Pain location: anterior Rt knee Pain description: achy Aggravating factors: morning sometimes, evening after activity.  Relieving factors: ice, pain meds.    OBJECTIVE: (objective measures completed at initial evaluation  unless otherwise dated)  DIAGNOSTIC FINDINGS: U/S: Small degree of fluid/hyperemia suggestive of patellar contusion or aseptic prepatellar bursitis    PATIENT SURVEYS:  08/26/2022: FOTO update 60   08/02/22: FOTO 59 (Met) 06/28/22: FOTO 26 (predicted 45)   EDEMA:  06/28/22: small pocket of fluid noted suprapatellar space, diffuse edema Rt knee but not formally measured   PALPATION: 06/28/22: tenderness to light touch with patella mobility, medial joint line tenderness, pes anserine tenderness   LOWER EXTREMITY ROM:   Active ROM Right eval Left eval Rt 07/10/22 Left/Right 08/02/2022 Left/Right 08/15/22  Knee flexion 95 118 120 130/121 130/ 122  Knee extension -2 3  0/-2 0/-2   (Blank rows = not tested)   Passive ROM Right eval Rt 07/10/22  Knee flexion 120 122  Knee extension 2 2   (Blank rows = not tested)   LOWER EXTREMITY MMT:   MMT Right eval Left eval Right 07/17/22 Left/Right 08/02/22 Right 08/26/2022  Knee flexion  3/5  4/5     Knee extension 3/5 (9.1#) 4/5 (24.97#) 26.65# 45.0/31.9 5/5 42.7, 38 lbs   (Blank rows = not tested)   FUNCTIONAL TESTING: 08/26/2022 : Rt SLS : 14 seconds c increased aberrant movement  08/09/2022:  Lt SLS:  14 seconds    Rt SLS:   5 seconds c increased aberrant movement  GAIT: 06/28/22 Distance walked: 100' Assistive device utilized:  had brace on Rt knee Level  of assistance: Modified independence Comments: antalgic gait on Rt, decreased stance Rt, decr hip/knee flexion on Rt with circumduction     TODAY'S TREATMENT: 08/26/2022: Therex: Recumbent bike lvl 4 6.5 mins, seat 4 Lateral step down 4 inch step 2 x 10 bilateral - slow lowering focus Machine knee extension double leg up, single leg eccentric lowering (extension limited from end range to avoid pain) 10 lb 2 x 15 bilateral   Neuro Re-ed SLS on foam with corner touching contralateral leg x 8 each, performed bilaterally   08/21/2022: Therex: Recumbent bike lvl 4 8 mins,  seat 4 Step up fwd 6 inch c TKE blue band Rt leg WB 2 x 10  Leg press Rt leg only 37 lbs 2 x 15 slow lowering focus Machine knee extension double leg up, single leg eccentric lowering (extension limited from end range to avoid pain) 10 lb 2 x 10 bilateral Standing lateral stepping green band 10 ft x 5 each way in partial squat hold  Neuro Re-ed SLS on black mat with corner touching contralateral leg x 6 each, performed bilaterally Tandem ambulation fwd/back 10 ft x 5 each way  08/19/2022: Therex: SciFit bike LE only lvl 3.5 RPM around 60 8 mins (seat 10) Lateral step down eccentric control focus 4 inch 2 x 10 bilateral (6 inch step caused Rt knee pain) 23 inch table height squat to touch and stand x 15 Seated SLR c quad set 2 x 10 bilateral 2 sec hold 1.5 lbs  Machine knee extension double leg up, single leg eccentric lowering (extension limited from end range to avoid pain) 10 lb 2 x 10 bilateral Incline gastroc stretch 30 sec x 3 bilateral  Neuro Re-ed SLS c contralateral leg cone touching 3 cones x 6 each bilateral  PATIENT EDUCATION:  08/26/2022 Education details: HEP review Person educated: Patient Education method: Consulting civil engineer, Media planner, and Handouts Education comprehension: verbalized understanding, returned demonstration, and needs further education     HOME EXERCISE PROGRAM: Access Code: [email or web address] URL: [email or web address] Date: 08/15/2022 Prepared by: Jamey Reas  Exercises - [place] Sitting Quad Set  - 2-3 x daily - 7 x weekly - 1 sets - 10 reps - 5 sec hold - Supine Heel Slide with Strap  - 2-3 x daily - 7 x weekly - 1 sets - 10 reps - 2-3 sec hold - Seated Knee Extension AROM  - 2-3 x daily - 7 x weekly - 1 sets - 10 reps - 5 sec hold hold - Sit to Stand  - 3 x daily - 7 x weekly - 1 sets - 10 reps - Seated Straight Leg Heel Taps  - 1-2 x daily - 7 x weekly - 3 sets - 10 reps - Gastroc Stretch on Step  - 1-2 x daily - 7 x weekly - 1 sets - 2-3  reps - 20-30 seconds hold - Standing Quad Stretch with Strap  - 1-2 x daily - 7 x weekly - 1 sets - 2-3 reps - 20-30 seconds hold - Seated Hamstring Stretch with Strap  - 1-2 x daily - 7 x weekly - 1 sets - 2-3 reps - 20-30 seconds hold    ASSESSMENT:   CLINICAL IMPRESSION: Pt has attended all approved visits at this time.  Pt has reported approx. 75% overall improvement to normal.  See objective data for updated information.  Gains noted in strength , movement coordination and overall functional ability per FOTO compared to evaluation.  Pt does continue to present c  Rt knee pain c difficulty in work related tasks such as prolonged standing/walking.  Pt to return to MD for follow up.  Any additional visits would require additional approval at that time.    OBJECTIVE IMPAIRMENTS Abnormal gait, decreased mobility, difficulty walking, decreased ROM, decreased strength, increased edema, and pain.    ACTIVITY LIMITATIONS carrying, lifting, bending, sitting, standing, squatting, stairs, transfers, bed mobility, locomotion level, and caring for others   PARTICIPATION LIMITATIONS: meal prep, cleaning, laundry, driving, shopping, community activity, and occupation   PERSONAL FACTORS 1-2 comorbidities: HTN, asthma  are also affecting patient's functional outcome.    REHAB POTENTIAL: Good   CLINICAL DECISION MAKING: Stable/uncomplicated   EVALUATION COMPLEXITY: Low     GOALS: Goals reviewed with patient? Yes   SHORT TERM GOALS: Target date: 07/26/2022   Independent with initial HEP Goal status: Met 08/02/22   2.  Rt knee AROM improved 0-110 for improved mobility Goal status: Partially Met 08/02/2022     [place] TERM GOALS: Target date: 09/20/2022   Independent with final HEP Goal status: Met 08/26/2022   2.  FOTO score improved to 45 Goal status: Met 08/02/2022   3.  Rt knee AROM improved to 0-120 for improved function Goal status: Met 08/26/2021   4.  Report pain < 2/10 with work  simulated activities for improved function Goal status: On Going 08/26/2022   5.  Demonstrate at least Rt knee ext strength of 50# in order to perform work related activities Goal status: on going 08/26/2022 to reach 50 lbs as discussed in previous progress note         PLAN: PT FREQUENCY: 1-2x/week (12 visits approved)   PT DURATION: 12 weeks   PLANNED INTERVENTIONS: Therapeutic exercises, Therapeutic activity, Neuromuscular re-education, Balance training, Gait training, Patient/Family education, Self Care, Joint mobilization, Stair training, Aquatic Therapy, Dry Needling, Electrical stimulation, Cryotherapy, Moist heat, Taping, Vasopneumatic device, Ultrasound, Ionotophoresis 70m/ml Dexamethasone, Manual therapy, and Re-evaluation   PLAN FOR NEXT SESSION: Hold until any approved visits granted.   MScot Jun PT, DPT, OCS, ATC 08/26/22  8:37 AM   PHYSICAL THERAPY DISCHARGE SUMMARY  Visits from Start of Care: 12  Current functional level related to goals / functional outcomes: See note   Remaining deficits: See note   Education / Equipment: HEP  Patient goals were  mostly met . Patient is being discharged due to not returning since the last visit./end of authorized visits.   MScot Jun PT, DPT, OCS, ATC 10/02/22  12:02 PM

## 2022-08-30 ENCOUNTER — Encounter: Payer: Self-pay | Admitting: Rehabilitative and Restorative Service Providers"

## 2022-09-09 ENCOUNTER — Ambulatory Visit (INDEPENDENT_AMBULATORY_CARE_PROVIDER_SITE_OTHER): Payer: Worker's Compensation | Admitting: Sports Medicine

## 2022-09-09 ENCOUNTER — Encounter: Payer: Self-pay | Admitting: Sports Medicine

## 2022-09-09 ENCOUNTER — Ambulatory Visit: Payer: Self-pay

## 2022-09-09 DIAGNOSIS — M7041 Prepatellar bursitis, right knee: Secondary | ICD-10-CM | POA: Diagnosis not present

## 2022-09-09 DIAGNOSIS — M25561 Pain in right knee: Secondary | ICD-10-CM

## 2022-09-09 MED ORDER — BUPIVACAINE HCL 0.25 % IJ SOLN
0.5000 mL | INTRAMUSCULAR | Status: AC | PRN
  Administered 2022-09-09: .5 mL via INTRA_ARTICULAR

## 2022-09-09 MED ORDER — BETAMETHASONE SOD PHOS & ACET 6 (3-3) MG/ML IJ SUSP
6.0000 mg | INTRAMUSCULAR | Status: AC | PRN
  Administered 2022-09-09: 6 mg via INTRA_ARTICULAR

## 2022-09-09 MED ORDER — LIDOCAINE HCL 1 % IJ SOLN
2.0000 mL | INTRAMUSCULAR | Status: AC | PRN
  Administered 2022-09-09: 2 mL

## 2022-09-09 NOTE — Progress Notes (Signed)
Jodi Hunt - 55 y.o. female MRN [id number]  Date of birth: [birth year]  Office Visit Note: Visit Date: 09/09/2022 PCP: Patient, No Pcp Per Referred by: No ref. provider found  Subjective: Chief Complaint  Patient presents with   Right Knee - Pain, Follow-up   HPI: Jodi Hunt is a pleasant 56 y.o. female who presents today for follow-up of right knee pain and prepatellar bursitis.  She has completed formalized physical therapy, although she has been consistent with the home exercises.  Since the last time, she was able to get her knee compression sleeve, which she does state helps with some of the swelling and the pain.  However, after wearing it for a while it will follow down on the knee.  She will take occasional Advil as needed for the pain.  She has been back to work, does not have too much pain at work but after longer days she will get soreness and pain near the end of the day.  She denies any new injury.  Unfortunately, she has had some reaccumulation of fluid.  Having some recurrence of the knee pain.  Also feels like maybe she is walking differently, because the contralateral knee is also bothering her lately.  Taking meloxicam once daily as needed.  Pertinent ROS were reviewed with the patient and found to be negative unless otherwise specified above in HPI.   Assessment & Plan: Visit Diagnoses:  1. Right knee pain, unspecified chronicity   2. Prepatellar bursitis, right knee    Plan: Discussed with Jodi Hunt all treatment options for her knee.  She does have a reaccumulation of her prepatellar fluid which I feel likely be contributing to some of her pain.  She continues to feel ligamentously stable in the knee.  Through shared decision making, elected to proceed with aspiration and subsequent injection of the knee today.  Tolerated well and saw improvement following this.  Did recommend that she rest for the next 2 days, work note provided.  She may return to work  without restriction on Wednesday.  I do want her to keep the knee compression sleeve in place to help with prevention of reaccumulation of fluid and support for the knee.  Hopefully this will not reaccumulate.  We discussed other options such as bursectomy in the future if this were to continue versus even possible advanced imaging such as MRI, but will hold for now and see how she does with the aspiration. She will continue with her home rehab therapy program.  May take meloxicam as needed for pain or swelling, although will change from daily use to as needed use.  She will follow-up in 6 weeks, may call or return sooner if any issues arise.  Follow-up: Return in about 6 weeks (around 10/21/2022).   Meds & Orders: No orders of the defined types were placed in this encounter.   Orders Placed This Encounter  Procedures   Large Joint Inj   US Guided Needle Placement - No Linked Charges    Procedures: Large Joint Inj: R knee on 09/09/2022 9:20 AM Indications: joint swelling and pain Details: 25 G 1.5 in needle, ultrasound-guided Medications: 2 mL lidocaine 1 %; 6 mg betamethasone acetate-betamethasone sodium phosphate 6 (3-3) MG/ML; 0.5 mL bupivacaine 0.25 % Aspirate: 8 mL clear Outcome: tolerated well, no immediate complications  US-guided Knee (Prepatellar) Aspiration, Right: After discussion on risks/benefits/indications was provided, informed verbal consent was obtained and a timeout was performed, patient was lying supine on exam table.  The knee was prepped with alcohol swab.  Utilizing superolateral approach, approximately 2 mL of lidocaine 1% was used for local anesthesia. Then using an 18g needle on 10cc syringe, 8 mL of clear synovial-colored fluid was aspirated from the prepatellar joint of the knee. Utilizing the same portal, the knee joint was then injected with 0.5:0.5cc bupivicaine:betamethasone.  Patient tolerated procedure well without immediate complications. The knee was wrapped  with coband compression wrap.  Procedure, treatment alternatives, risks and benefits explained, specific risks discussed. Consent was given by the patient. Immediately prior to procedure a time out was called to verify the correct patient, procedure, equipment, support staff and site/side marked as required. Patient was prepped and draped in the usual sterile fashion.          Clinical History: No specialty comments available.  She reports that she has never smoked. She has never used smokeless tobacco. No results for input(s): "HGBA1C", "LABURIC" in the last 8760 hours.  Objective:   Vital Signs: There were no vitals taken for this visit.  Physical Exam  Gen: Well-appearing, in no acute distress; non-toxic CV: Regular Rate. Well-perfused. Warm.  Resp: Breathing unlabored on room air; no wheezing. Psych: Fluid speech in conversation; appropriate affect; normal thought process Neuro: Sensation intact throughout. No gross coordination deficits.   Ortho Exam - Right knee: Evaluation of the right knee does show a mild-moderate.  Accumulation within the prepatellar bursa without any surrounding redness or erythema.  There is no warmth to the knee.  No joint effusion about the knee.  Range of motion from 0-130 degrees.  There are some tenderness over top of the prepatellar bursa as well as the medial and lateral joint line to a lesser extent.  No varus or valgus instability.  Ligamentously intact.  Strength 5/5 throughout.  Imaging:    Past Medical/Family/Surgical/Social History: Medications & Allergies reviewed per EMR, new medications updated. There are no problems to display for this patient.  Past Medical History:  Diagnosis Date   Arthritis    Asthma    Hypertension    Family History  Problem Relation Age of Onset   Hyperlipidemia Mother    Hypertension Mother    Cancer Father    Diabetes Brother    Hypertension Brother    Past Surgical History:  Procedure Laterality  Date   THYROIDECTOMY  73   Social History   Occupational History   Not on file  Tobacco Use   Smoking status: Never   Smokeless tobacco: Never  Substance and Sexual Activity   Alcohol use: No   Drug use: No   Sexual activity: Not on file

## 2022-09-09 NOTE — Progress Notes (Signed)
Doing somewhat better; Has aches/pains throughout her body She is on medication for her back pain, says it helps some with knee Does have a knee sleeve she wears sparingly because it slides down on her

## 2022-10-23 ENCOUNTER — Ambulatory Visit: Payer: BLUE CROSS/BLUE SHIELD | Admitting: Sports Medicine

## 2022-10-29 ENCOUNTER — Ambulatory Visit (INDEPENDENT_AMBULATORY_CARE_PROVIDER_SITE_OTHER): Payer: Worker's Compensation | Admitting: Sports Medicine

## 2022-10-29 ENCOUNTER — Encounter: Payer: Self-pay | Admitting: Sports Medicine

## 2022-10-29 DIAGNOSIS — M059 Rheumatoid arthritis with rheumatoid factor, unspecified: Secondary | ICD-10-CM

## 2022-10-29 DIAGNOSIS — S83206A Unspecified tear of unspecified meniscus, current injury, right knee, initial encounter: Secondary | ICD-10-CM

## 2022-10-29 DIAGNOSIS — M7041 Prepatellar bursitis, right knee: Secondary | ICD-10-CM

## 2022-10-29 DIAGNOSIS — G8929 Other chronic pain: Secondary | ICD-10-CM

## 2022-10-29 DIAGNOSIS — M25561 Pain in right knee: Secondary | ICD-10-CM

## 2022-10-29 NOTE — Progress Notes (Signed)
Jodi Hunt - 56 y.o. female MRN [id number]  Date of birth: [birth year]  Office Visit Note: Visit Date: 10/29/2022 PCP: Patient, No Pcp Per Referred by: No ref. provider found  Subjective: Chief Complaint  Patient presents with   Right Knee - Follow-up   HPI: Jodi Hunt is a pleasant 57 y.o. female who presents today for follow-up of right knee pain and prepatellar bursitis.   Independent review of outside note from [place] [facility] from 09/06/2022.  She is managed on methotrexate, folic acid as well as Plaquenil 300 mg.  Review of previous labs with positive RA titer, positive anti-CCP antibodies indicative of rheumatoid arthritis.  Review of labs from 09/06/2022 showed a markedly elevated CRP and ESR.   Unfortunately, Jodi Hunt thinks her knee pain is getting worse.  She has completed all formalized physical therapy, but still does do her home exercises.  She had a flareup of the swelling a few weeks ago, but her niece/granddaughter bumped into the knee and helped improve her swelling.  She still continues wearing the knee compression sleeve which does help control some of the swelling.  He does report some clicking and catching sensation over the medial aspect of the knee, came and went since beginning of her injury.  She denies any new falls.  She is taking Celebrex nightly, also took a prednisone course previously from her rheumatologist.  She was told she had elevated inflammatory markers at her last appointment  Pertinent ROS were reviewed with the patient and found to be negative unless otherwise specified above in HPI.   Assessment & Plan: Visit Diagnoses:  1. Chronic pain of right knee   2. Prepatellar bursitis, right knee   3. Positive McMurray test of right knee, initial encounter   4. Rheumatoid arthritis with positive rheumatoid factor, involving unspecified site [facility])    Plan: Discussed her right knee issue with her -she has had  prepatellar bursitis flares in the past from her original injury falling onto the knee, she has underwent successful aspiration and injection into the knee twice which provided her good relief.  However here over the coming weeks she feels like the rest of the knee continues to cause her pain.  Her exam today suggest possible meniscal involvement or possible chondromalacia patella, each of which would need to be further evaluated with MRI.  Given that she has never gotten back to 100% since her injury, we we will order MRI of the knee without contrast today.  I discussed with her her we need to rule out any underlying pathology, however this could also be related to her rheumatoid arthritis which she did have recently elevated inflammatory markers.  This systemic disease is being managed by rheumatology.  I will discontinue the meloxicam medicine today given concominant NSAID use.  She will take Celebrex 200 mg nightly, she may increase twice daily dosing for short periods if she has pain or swelling of the knee.  We will follow-up 3 business days after the MRI.  Follow-up: Return for F/u 3 business days after MRI scan of knee with Dr. Rolena Infante.   Meds & Orders: No orders of the defined types were placed in this encounter.   Orders Placed This Encounter  Procedures   MR Knee Right w/o contrast   - discontinue Meloxicam 25m qd - continue Celebrex [id number] --> may increase to BID for few days if flare   Procedures: No procedures performed  Clinical History: No specialty comments available.  She reports that she has never smoked. She has never used smokeless tobacco. No results for input(s): "HGBA1C", "LABURIC" in the last 8760 hours.  Objective:   Vital Signs: There were no vitals taken for this visit.  Physical Exam  Gen: Well-appearing, in no acute distress; non-toxic CV: Well-perfused. Warm.  Resp: Breathing unlabored on room air; no wheezing. Psych: Fluid speech in conversation;  appropriate affect; normal thought process Neuro: Sensation intact throughout. No gross coordination deficits.   Ortho Exam - Right knee: There is a small effusion of the right knee, no significant prepatellar effusion noted today.  There is positive TTP over the medial joint line and with patellar compression.  Range of motion from 0-130 degrees, although some pain at endrange flexion.  No varus or valgus instability.  Positive patellar compression, positive McMurray's test with associated clicking.  Thessaly's test causes pain both with simple knee flexion as well as twisting.  Calves are soft and nontender bilaterally.  Imaging:  *Review of the x-ray from 06/05/2022 does not show any osseous abnormalities.  Joint spaces are relatively well-preserved on the medial and lateral joint line.  No joint effusion noted.  CLINICAL DATA:  Right knee pain after fall.   EXAM: RIGHT KNEE - COMPLETE 4+ VIEW   COMPARISON:  July 21, 2009.   FINDINGS: No evidence of fracture, dislocation, or joint effusion. No evidence of arthropathy or other focal bone abnormality. Soft tissues are unremarkable.   IMPRESSION: Negative.     Electronically Signed   By: Marijo Conception M.D.   On: 06/05/2022 09:41  Past Medical/Family/Surgical/Social History: Medications & Allergies reviewed per EMR, new medications updated. There are no problems to display for this patient.  Past Medical History:  Diagnosis Date   Arthritis    Asthma    Hypertension    Family History  Problem Relation Age of Onset   Hyperlipidemia Mother    Hypertension Mother    Cancer Father    Diabetes Brother    Hypertension Brother    Past Surgical History:  Procedure Laterality Date   THYROIDECTOMY  30   Social History   Occupational History   Not on file  Tobacco Use   Smoking status: Never   Smokeless tobacco: Never  Substance and Sexual Activity   Alcohol use: No   Drug use: No   Sexual activity: Not on  file

## 2022-10-29 NOTE — Progress Notes (Signed)
mrTook about 3 days for swelling to return from last visit. States her granddaughter actually hit her knee on christmas day; and the swelling "went away"

## 2022-11-27 ENCOUNTER — Ambulatory Visit (INDEPENDENT_AMBULATORY_CARE_PROVIDER_SITE_OTHER): Payer: Worker's Compensation | Admitting: Sports Medicine

## 2022-11-27 ENCOUNTER — Telehealth: Payer: Self-pay | Admitting: Sports Medicine

## 2022-11-27 DIAGNOSIS — M1711 Unilateral primary osteoarthritis, right knee: Secondary | ICD-10-CM | POA: Diagnosis not present

## 2022-11-27 DIAGNOSIS — S8001XS Contusion of right knee, sequela: Secondary | ICD-10-CM

## 2022-11-27 DIAGNOSIS — M25561 Pain in right knee: Secondary | ICD-10-CM | POA: Diagnosis not present

## 2022-11-27 DIAGNOSIS — G8929 Other chronic pain: Secondary | ICD-10-CM

## 2022-11-27 NOTE — Progress Notes (Signed)
Jodi Hunt - 57 y.o. female MRN [id number]  Date of birth: Dec 08, 1965  Office Visit Note: Visit Date: 11/27/2022 PCP: Patient, No Pcp Per Referred by: No ref. provider found  Subjective: Chief Complaint  Patient presents with   Right Knee - Pain   HPI: Jodi Hunt is a pleasant 57 y.o. female who presents today for follow-up of right knee.  DOI: 06/05/22 --> fell directly onto the right knee at work.  Jodi Hunt is still having some pain in the knee.  She notes she is probably about 80% improved from her initial knee contusion injury.  Swelling in the knee comes and goes otherwise more mild than previously.  Her main concern is that with walking and steps at times she feels like the knee will give out on her.  She has completed formalized physical therapy in the past, but has been finished with this for few months now.  She is taking Celebrex 200 mg twice daily.  At times will take Tylenol.  She continues with a knee compression sleeve.  She had her knee MRI as seen below.  Pertinent ROS were reviewed with the patient and found to be negative unless otherwise specified above in HPI.   Assessment & Plan: Visit Diagnoses:  1. Osteoarthritis of right patellofemoral joint   2. Chronic pain of right knee   3. Contusion of right knee, sequela    Plan: I did review her MRI with her today, which most notably shows high-grade cartilage loss and arthritic change of the patellofemoral compartment.  This does seem to fit with her symptoms and pain.  We discussed that this was a chronic finding that had likely been present previously, but was certainly exacerbated by her direct fall onto the knee months ago.  The MRI report did show a free edge tear of the body of the lateral meniscus, at times she will get pain over this region but I am not sure if this is where her pain is emanating from.  At this point, I would like her to see one of my orthopedic surgeons to review her MRI and to  discuss if they feel any surgical options would be a consideration for her.  As a reminder, she has had 2 prepatellar effusions aspirated with injection that helped her pain significantly, but she has not had an intra-articular injection yet.  This may be an option going forward, but I would like to have a surgical opinion prior to this time.  She did benefit from physical therapy in the past, I would like to get her started back on this and attempt to improve her knee pain.  She will continue full duty without restrictions at this time. I will see her back depending on Dr. Randel Pigg evaluation and treatment plan. If a surgical treatment is pursued, he would then take over her care.   Follow-up: With Dr. Marlou Sa or Dr. Ninfa Linden for right knee - discuss any surgical options   Meds & Orders:  - Reduce Celebrex from 200mg  BID PRN --> 200mg  daily (avoid 2nd dose on continued basis)   Orders Placed This Encounter  Procedures   Ambulatory referral to Physical Therapy   Ambulatory referral to Orthopedic Surgery     Procedures: No procedures performed      Clinical History: No specialty comments available.  She reports that she has never smoked. She has never used smokeless tobacco. No results for input(s): "HGBA1C", "LABURIC" in the last 8760 hours.  Objective:  Physical Exam  Gen: Well-appearing, in no acute distress; non-toxic CV:  Well-perfused. Warm.  Resp: Breathing unlabored on room air; no wheezing. Psych: Fluid speech in conversation; appropriate affect; normal thought process Neuro: Sensation intact throughout. No gross coordination deficits.   Ortho Exam - Right knee: Examination of the knee shows a very small prepatellar effusion and right knee mild effusion.  There is pain palpating on the medial aspect of the patellofemoral joint.  There is some mild lateral joint line TTP.  Range of motion from 0-130 degrees with some pain at endrange flexion.  There is no varus or valgus  instability.  There is associated McMurray's test on the lateral compartment. Most significant pain with Clark's grind and patellar compression testing.  There is patellar crepitus noted with knee flexion extension.  Calves are soft and nontender bilaterally. NVI.  Imaging:  MRI R-knee w/o contrast (11/19/22): -Report an MRI disc was reviewed and interpreted by myself.  Radiology report shows a free edge tear of the body of the lateral meniscus, upon my review this is present, but very mild.  There are tricompartmental degenerative changes, most significant in the patellofemoral compartment with evidence of chondromalacia and underlying subchondral edema.  Radiology read grade 4 chondrosis in the patellofemoral compartment.  There is evidence of synovitis with a small knee joint effusion and prepatellar effusion.  No acute fracture noted.  Past Medical/Family/Surgical/Social History: Medications & Allergies reviewed per EMR, new medications updated. There are no problems to display for this patient.  Past Medical History:  Diagnosis Date   Arthritis    Asthma    Hypertension    Family History  Problem Relation Age of Onset   Hyperlipidemia Mother    Hypertension Mother    Cancer Father    Diabetes Brother    Hypertension Brother    Past Surgical History:  Procedure Laterality Date   THYROIDECTOMY  77   Social History   Occupational History   Not on file  Tobacco Use   Smoking status: Never   Smokeless tobacco: Never  Substance and Sexual Activity   Alcohol use: No   Drug use: No   Sexual activity: Not on file

## 2022-11-27 NOTE — Telephone Encounter (Signed)
11/27/22 ov note faxed to [place], [place] [phone]

## 2022-11-27 NOTE — Progress Notes (Deleted)
   Office Visit Note   Patient: Jodi Hunt           Date of Birth: 01/13/1966           MRN: [id number] Visit Date:               Requested by: No referring provider defined for this encounter. PCP: Patient, No Pcp Per   Assessment & Plan: Visit Diagnoses:  1. Osteoarthritis of right patellofemoral joint   2. Chronic pain of right knee   3. Contusion of right knee, sequela     Plan: ***  Follow-Up Instructions: No follow-ups on file.   Orders:  Orders Placed This Encounter  Procedures  . Ambulatory referral to Physical Therapy  . Ambulatory referral to Orthopedic Surgery   No orders of the defined types were placed in this encounter.     Procedures: No procedures performed   Clinical Data: No additional findings.   Subjective: Chief Complaint  Patient presents with  . Right Knee - Pain    HPI  Review of Systems   Objective: Vital Signs: There were no vitals taken for this visit.  Physical Exam  Ortho Exam  Specialty Comments:  No specialty comments available.  Imaging: No results found.   PMFS History: There are no problems to display for this patient.  Past Medical History:  Diagnosis Date  . Arthritis   . Asthma   . Hypertension     Family History  Problem Relation Age of Onset  . Hyperlipidemia Mother   . Hypertension Mother   . Cancer Father   . Diabetes Brother   . Hypertension Brother     Past Surgical History:  Procedure Laterality Date  . THYROIDECTOMY  1991   Social History   Occupational History  . Not on file  Tobacco Use  . Smoking status: Never  . Smokeless tobacco: Never  Substance and Sexual Activity  . Alcohol use: No  . Drug use: No  . Sexual activity: Not on file

## 2022-12-12 ENCOUNTER — Other Ambulatory Visit: Payer: Self-pay

## 2022-12-12 ENCOUNTER — Telehealth: Payer: Self-pay

## 2022-12-12 ENCOUNTER — Ambulatory Visit: Payer: Worker's Compensation | Admitting: Orthopedic Surgery

## 2022-12-12 ENCOUNTER — Encounter: Payer: Self-pay | Admitting: Orthopedic Surgery

## 2022-12-12 DIAGNOSIS — M1711 Unilateral primary osteoarthritis, right knee: Secondary | ICD-10-CM | POA: Diagnosis not present

## 2022-12-12 DIAGNOSIS — G8929 Other chronic pain: Secondary | ICD-10-CM

## 2022-12-12 NOTE — Progress Notes (Signed)
Office Visit Note   Patient: Jodi Hunt           Date of Birth: Jul 07, 1966           MRN: [id number] Visit Date: 12/12/2022 Requested by: Elba Barman, [place],   [id number] PCP: Patient, No Pcp Per  Subjective: Chief Complaint  Patient presents with   Right Knee - Pain    HPI: Jodi Hunt is a 57 y.o. female who presents to the office reporting right knee pain.  She had a fall 06/05/2022.  This occurred while she was at work.  She was walking in a child darted in front of her.  She fell on both her hands and knees.  Taking Celebrex Neurontin and Robaxin.  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  She had an aspiration and injection in November which did help.  There is old notes were reviewed.  No prior surgery to the knee.  Current symptoms include weakness giving way locking popping and pain which occasionally wakes her from sleep at night as well as stiffness of the knee in the morning.  MRI scan has been performed.  I reviewed that with the patient.  Does show free edge irregularity of the lateral meniscus as well as significant patellofemoral arthritis.  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                ROS: All systems reviewed are negative as they relate to the chief complaint within the history of present illness.  Patient denies fevers or chills.  Assessment & Plan: Visit Diagnoses:  1. Osteoarthritis of right patellofemoral joint     Plan: Impression is right knee pain following impact injury 6 months ago.  I think is likely that the impact on the patella aggravated some pre-existing patellofemoral arthritis.  The lateral meniscal free edge tear versus irregularity is not something that really requires arthroscopic intervention at this time.  I think the  chances of making her worse outweigh any potential benefit of arthroscopic evaluation of the joint and treatment.  I do however think that gel injection could be helpful for the right knee.  Would like to get that approved and have her follow-up with Dr. Rolena Infante for that intervention.  We will see her back as needed.  Follow-Up Instructions: No follow-ups on file.   Orders:  No orders of the defined types were placed in this encounter.  No orders of the defined types were placed in this encounter.     Procedures: No procedures performed   Clinical Data: No additional findings.  Objective: Vital Signs: There were no vitals taken for this visit.  Physical Exam:  Constitutional: Patient appears well-developed HEENT:  Head: Normocephalic Eyes:EOM are normal Neck: Normal range of motion Cardiovascular: Normal rate Pulmonary/chest: Effort normal Neurologic: Patient is alert Skin: Skin is warm Psychiatric: Patient has normal mood and affect  Ortho Exam: Ortho exam demonstrates significant patellofemoral crepitus with range of motion of the knee.  Collateral cruciate ligaments are stable on the right-hand side.  Not much effusion is present in the right knee.  She has mild medial and lateral joint line tenderness with equivocal McMurray compression testing bilaterally.  Pedal pulses palpable.  No groin pain with internal or external rotation of the hip on the right.+   This  patient is diagnosed with osteoarthritis of the knee(s).    Radiographs show evidence of joint space narrowing, osteophytes, subchondral sclerosis and/or subchondral cysts.  This patient has knee pain which interferes with functional and activities of daily living.    This patient has experienced inadequate response, adverse effects and/or intolerance with conservative treatments such as acetaminophen, NSAIDS, topical creams, physical therapy or regular exercise, knee bracing and/or weight loss.   This patient has  experienced inadequate response or has a contraindication to intra articular steroid injections for at least 3 months.   This patient is not scheduled to have a total knee replacement within 6 months of starting treatment with viscosupplementation.  Specialty Comments:  No specialty comments available.  Imaging: No results found.   PMFS History: There are no problems to display for this patient.  Past Medical History:  Diagnosis Date   Arthritis    Asthma    Hypertension     Family History  Problem Relation Age of Onset   Hyperlipidemia Mother    Hypertension Mother    Cancer Father    Diabetes Brother    Hypertension Brother     Past Surgical History:  Procedure Laterality Date   THYROIDECTOMY  45   Social History   Occupational History   Not on file  Tobacco Use   Smoking status: Never   Smokeless tobacco: Never  Substance and Sexual Activity   Alcohol use: No   Drug use: No   Sexual activity: Not on file

## 2022-12-12 NOTE — Telephone Encounter (Signed)
Auth needed from w/c for right knee gel injection and referral back to Dr Rolena Infante for injection per Dr Marlou Sa.  Case manager present with patient at appointment today.  His name is Jodi Hunt [phone] [email or web address]

## 2022-12-19 NOTE — Telephone Encounter (Signed)
Gel shot type per brooks thx

## 2023-01-06 ENCOUNTER — Ambulatory Visit (INDEPENDENT_AMBULATORY_CARE_PROVIDER_SITE_OTHER): Payer: Worker's Compensation | Admitting: Sports Medicine

## 2023-01-06 ENCOUNTER — Encounter: Payer: Self-pay | Admitting: Sports Medicine

## 2023-01-06 DIAGNOSIS — G8929 Other chronic pain: Secondary | ICD-10-CM | POA: Diagnosis not present

## 2023-01-06 DIAGNOSIS — M25561 Pain in right knee: Secondary | ICD-10-CM

## 2023-01-06 DIAGNOSIS — M1711 Unilateral primary osteoarthritis, right knee: Secondary | ICD-10-CM

## 2023-01-06 MED ORDER — HYALURONAN 30 MG/2ML IX SOSY
30.0000 mg | PREFILLED_SYRINGE | INTRA_ARTICULAR | Status: AC | PRN
  Administered 2023-01-06: 30 mg via INTRA_ARTICULAR

## 2023-01-06 MED ORDER — METHYLPREDNISOLONE ACETATE 40 MG/ML IJ SUSP
40.0000 mg | INTRAMUSCULAR | Status: AC | PRN
  Administered 2023-01-06: 40 mg via INTRA_ARTICULAR

## 2023-01-06 MED ORDER — LIDOCAINE HCL 1 % IJ SOLN
1.0000 mL | INTRAMUSCULAR | Status: AC | PRN
  Administered 2023-01-06: 1 mL

## 2023-01-06 MED ORDER — BUPIVACAINE HCL 0.25 % IJ SOLN
1.0000 mL | INTRAMUSCULAR | Status: AC | PRN
  Administered 2023-01-06: 1 mL via INTRA_ARTICULAR

## 2023-01-06 NOTE — Progress Notes (Signed)
Jodi Hunt - 57 y.o. female MRN [id number]  Date of birth: Mar 14, 1966  Office Visit Note: Visit Date: 01/06/2023 PCP: Patient, No Pcp Per Referred by: No ref. provider found  Subjective: Chief Complaint  Patient presents with   Right Knee - Pain   HPI: Jodi Hunt is a pleasant 57 y.o. female who presents today for chronic right knee pain.  DOI: Fall at work on 06/05/22. A child darted in front of her and she fell forward onto her hands and knees.   She did see my partner at my request, Dr. Marlou Sa, we did review her MRI and also agreed that most significant was her patellofemoral arthritis, did not think her lateral meniscus free edge tear would benefit from any surgical intervention. Recommended viscosupplementation.   Today, Jodi Hunt states she is doing about the same.  She is undergoing physical therapy at [facility]. She has Celebrex '200mg'$  that she will take for pain. She is agreeable to proceed with viscosupplementation today. She is working full duty currently without restrictions.  Pertinent ROS were reviewed with the patient and found to be negative unless otherwise specified above in HPI.   Assessment & Plan: Visit Diagnoses:  1. Osteoarthritis of right patellofemoral joint   2. Chronic pain of right knee    Plan: Discussed with Jodi Hunt treatment options for her knee pain and patellofemoral arthritis. We discussed both being reassured that Dr. Marlou Sa agreed there is no indication for surgical intervention for the knee at this time. Through shared-decision making, elected to proceed with intra-articular knee injection - we discussed with this first viscosupplementation injection only performing with concomitant corticosteroid injection to help reduce flare and pain control. Also, may use ice/heat. She will present for subsequent viscosupplementation only injection weekly for #2 and #3.  She will continue Celebrex, formalized physical therapy.  Note provided for work  today to excuse her if she needed it due to the procedure.  She will continue starting tomorrow working full duty without restrictions.  May use her knee compression sleeve for support if desired.  Follow-up: Return in about 1 week (around 01/13/2023) for for Visco #2 .   Meds & Orders: No orders of the defined types were placed in this encounter.   Orders Placed This Encounter  Procedures   Large Joint Inj     Procedures: Large Joint Inj: R knee on 01/06/2023 9:20 AM Indications: pain Details: 22 G 1.5 in needle, anterolateral approach Medications: 1 mL lidocaine 1 %; 1 mL bupivacaine 0.25 %; 40 mg methylPREDNISolone acetate 40 MG/ML; 30 mg Hyaluronan 30 MG/2ML Outcome: tolerated well, no immediate complications  Knee Injection, Right: After discussion on risks/benefits/indications, informed verbal consent was obtained and a timeout was performed, patient was seated on exam table. The patient's knee was prepped with Betadine and alcohol swabs and utilizing anterolateral approach, the patient's knee was injected intraarticularly with 1:1:1 lidocaine 1%:bupivicaine 0.25%:depomedrol first, then using sterile syringe swap, was subsequently injected with 2 mL of OrthoVisc (high-molecular weight hyaluronan) '15mg'$ /mL. Patient tolerated the procedure well without immediate complications.  Procedure, treatment alternatives, risks and benefits explained, specific risks discussed. Consent was given by the patient. Immediately prior to procedure a time out was called to verify the correct patient, procedure, equipment, support staff and site/side marked as required. Patient was prepped and draped in the usual sterile fashion.          Clinical History: No specialty comments available.  She reports that she has never smoked. She has  never used smokeless tobacco. No results for input(s): "HGBA1C", "LABURIC" in the last 8760 hours.  Objective:    Physical Exam  Gen: Well-appearing, in no acute  distress; non-toxic CV: Regular Rate. Well-perfused. Warm.  Resp: Breathing unlabored on room air; no wheezing. Psych: Fluid speech in conversation; appropriate affect; normal thought process Neuro: Sensation intact throughout. No gross coordination deficits.   Ortho Exam - Right knee: Examination of the right knee there is a very small protrusion overlying the patella, likely a small prepatellar effusion without any warmth or redness.  I cannot palpate any effusion of the knee joint itself.  Mild TTP around the medial aspect of the patellofemoral joint and medial joint line.  Range of motion from 0-130 degrees with pain at endrange flexion.  There is no varus or valgus instability.  Positive patellar crepitus.  Neurovascular intact distally.  Imaging:  TECHNIQUE:MRI KNEE RIGHT WO IV CONTRAST-- Multiplanar, multisequence MR imaging of the knee was performed without contrast.   INDICATION: Right knee pain, unspecified chronicity.  Fall. Swelling. Pain.   COMPARISON:  None.    FINDINGS:   Bones: No acute fracture, avascular necrosis, or marrow replacement.   Anterior cruciate ligament: Intact.   Posterior cruciate ligament: Intact.   Medial meniscus: Intact.   Lateral meniscus: Horizontal/free edge tear of the body (series 8, image 10). Possible subtle complex tear of the anterior horn.   Medial collateral ligament: Intact.   Lateral collateral ligamentous structures (including fibular collateral ligament, biceps femoris tendon, popliteus tendon, and posterolateral corner structures): Intact.   Cartilage:  - Medial compartment: Low-grade chondral changes in the flexion zone of the medial femoral condyle.  - Lateral compartment: Focal full-thickness fissure in the posterior weightbearing lateral femoral condyle. Partial-thickness fissuring in the weightbearing lateral tibial plateau with tiny full-thickness fissure posteriorly.  - Patellofemoral compartment: Confluent full-thickness  cartilage loss in the median ridge extending into the lateral patellar facet. Underlying subchondral edema. Areas of full-thickness cartilage loss in the lateral trochlea with underlying subchondral edema.   Extensor mechanism, including quadriceps tendon, medial and lateral patellar retinaculum, and patellar tendon: Mild insertional quadriceps/patellar tendinosis.   IT band/pes anserine tendons: Intact.   Joint: Small effusion. Lateral patellar tilt. Synovitis.   Soft tissues: Tiny Baker cyst. Nonspecific prepatellar and superficial patellar subcutaneous edema.    IMPRESSION:  1. Free edge tear of the body of the lateral meniscus.  2.  Tricompartmental degenerative changes with grade 4 chondrosis in the patellofemoral compartment.  3.  Lateral patellar tilt. Small knee joint effusion. Synovitis.  4.  Mild tendinosis of the extensor mechanism.   Electronically Signed by: Salvadore Oxford, MD on 11/20/2022 1:08 PM   Past Medical/Family/Surgical/Social History: Medications & Allergies reviewed per EMR, new medications updated. There are no problems to display for this patient.  Past Medical History:  Diagnosis Date   Arthritis    Asthma    Hypertension    Family History  Problem Relation Age of Onset   Hyperlipidemia Mother    Hypertension Mother    Cancer Father    Diabetes Brother    Hypertension Brother    Past Surgical History:  Procedure Laterality Date   THYROIDECTOMY  42   Social History   Occupational History   Not on file  Tobacco Use   Smoking status: Never   Smokeless tobacco: Never  Substance and Sexual Activity   Alcohol use: No   Drug use: No   Sexual activity: Not on file

## 2023-01-16 ENCOUNTER — Ambulatory Visit (INDEPENDENT_AMBULATORY_CARE_PROVIDER_SITE_OTHER): Payer: Worker's Compensation | Admitting: Sports Medicine

## 2023-01-16 ENCOUNTER — Encounter: Payer: Self-pay | Admitting: Sports Medicine

## 2023-01-16 DIAGNOSIS — M25561 Pain in right knee: Secondary | ICD-10-CM | POA: Diagnosis not present

## 2023-01-16 DIAGNOSIS — M1711 Unilateral primary osteoarthritis, right knee: Secondary | ICD-10-CM | POA: Diagnosis not present

## 2023-01-16 DIAGNOSIS — G8929 Other chronic pain: Secondary | ICD-10-CM | POA: Diagnosis not present

## 2023-01-16 MED ORDER — HYALURONAN 30 MG/2ML IX SOSY
30.0000 mg | PREFILLED_SYRINGE | INTRA_ARTICULAR | Status: AC | PRN
  Administered 2023-01-16: 30 mg via INTRA_ARTICULAR

## 2023-01-16 NOTE — Progress Notes (Signed)
First few days after first one her knee was stiff; otherwise doing ok today

## 2023-01-16 NOTE — Progress Notes (Signed)
   Procedure Note  Patient: Jodi Hunt             Date of Birth: 1966-08-15           MRN: [id number]             Visit Date: 01/16/2023  [name] is a pleasant 57 year old female who presents today for follow-up of her chronic right knee pain with patellofemoral arthritis.  She is here today for Orthovisc #2 of her set of 3 injections.  Procedures: Visit Diagnoses:  1. Osteoarthritis of right patellofemoral joint   2. Chronic pain of right knee    Large Joint Inj: R knee on 01/16/2023 9:30 AM Indications: pain Details: 22 G 1.5 in needle, anterolateral approach Medications: 30 mg Hyaluronan 30 MG/2ML Outcome: tolerated well, no immediate complications  Knee Injection, Right: After discussion on risks/benefits/indications, informed verbal consent was obtained and a timeout was performed, patient was seated on exam table. The patient's knee was prepped with Betadine and alcohol swab and utilizing anterolateral approach, the patient's knee was injected intraarticularly with with 2 mL of OrthoVisc (high-molecular weight hyaluronan) 15mg /mL. Patient tolerated the procedure well without immediate complications.  Procedure, treatment alternatives, risks and benefits explained, specific risks discussed. Consent was given by the patient. Immediately prior to procedure a time out was called to verify the correct patient, procedure, equipment, support staff and site/side marked as required. Patient was prepped and draped in the usual sterile fashion.     - Tolerated procedure well. May use ice, Tylenol/Motrin as needed for pain - note provided for work today only if needed - has f/u in 1-week for 3rd visco injection  Elba Barman, DO [place]  This note was dictated using Dragon naturally speaking software and may contain errors in syntax, spelling, or content which have not been identified prior to signing this note.

## 2023-01-27 ENCOUNTER — Encounter: Payer: Self-pay | Admitting: Sports Medicine

## 2023-01-27 ENCOUNTER — Ambulatory Visit (INDEPENDENT_AMBULATORY_CARE_PROVIDER_SITE_OTHER): Payer: Worker's Compensation | Admitting: Sports Medicine

## 2023-01-27 DIAGNOSIS — G8929 Other chronic pain: Secondary | ICD-10-CM

## 2023-01-27 DIAGNOSIS — M25561 Pain in right knee: Secondary | ICD-10-CM

## 2023-01-27 DIAGNOSIS — M1711 Unilateral primary osteoarthritis, right knee: Secondary | ICD-10-CM | POA: Diagnosis not present

## 2023-01-27 MED ORDER — HYALURONAN 30 MG/2ML IX SOSY
30.0000 mg | PREFILLED_SYRINGE | INTRA_ARTICULAR | Status: AC | PRN
  Administered 2023-01-27: 30 mg via INTRA_ARTICULAR

## 2023-01-27 NOTE — Progress Notes (Signed)
   Procedure Note  Patient: Jodi Hunt             Date of Birth: [birth year]           MRN: [id number]             Visit Date: 01/27/2023  HPI: [name] is a pleasant 57 year old female who presents today for follow-up of her chronic right knee pain with significant patellofemoral arthritis - this was exacerbated by a fall at work. She is here today for Orthovisc #3 of her set of 3 injections.   PE:  - R knee: There is a small amount of prepatellar effusion. Mild TTP of medial aspect of patellofemoral joint. No instability. ROM 0 - 130 degrees.  Calves are soft nontender bilaterally. NVI.  Previous MRI: Bone-on-bone changes and grade IV chondromalacia present on that side along with some bone bruising indicating significant degenerative disease in that joint   Procedures: Visit Diagnoses:  1. Osteoarthritis of right patellofemoral joint   2. Chronic pain of right knee     Large Joint Inj: R knee on 01/27/2023 11:47 AM Details: 22 G 1.5 in needle, anterolateral approach Medications: 30 mg Hyaluronan 30 MG/2ML Outcome: tolerated well, no immediate complications  Knee Injection, Right: After discussion on risks/benefits/indications, informed verbal consent was obtained and a timeout was performed, patient was seated on exam table. The patient's knee was prepped with Betadine and alcohol swab and utilizing anterolateral approach, the patient's knee was injected intraarticularly with with 2 mL of OrthoVisc (high-molecular weight hyaluronan) 15mg /mL. Patient tolerated the procedure well without immediate complications  Procedure, treatment alternatives, risks and benefits explained, specific risks discussed. Consent was given by the patient. Immediately prior to procedure a time out was called to verify the correct patient, procedure, equipment, support staff and site/side marked as required. Patient was prepped and draped in the usual sterile fashion.     Plan: -Completing 3 of 3  Orthovisc gel injections today, patient tolerated well -May use ice or over-the-counter anti-inflammatories for any postinjection pain; note provided for work today -She will continue Celebrex 200 mg once daily to help control her arthritis -Discussed with [name] today that given her bone-on-bone change of the patellofemoral joint and chondromalacia, it is unlikely that she will have complete relief of her pain.  We are working on managing her function and improving her pain as much as possible.  She will continue her physical therapy. -Given that the Orthovisc injections will have a cumulative effect, would like to see her back about the 67-month mark to reevaluate how she is doing.  At this point, I do not see a need for surgical intervention. -She may continue working without work restriction at this time  Elba Barman, [place]  This note was dictated using Dragon naturally speaking software and may contain errors in syntax, spelling, or content which have not been identified prior to signing this note.

## 2023-01-27 NOTE — Progress Notes (Signed)
Feels like these are helping

## 2023-02-11 ENCOUNTER — Ambulatory Visit: Payer: BLUE CROSS/BLUE SHIELD | Admitting: Sports Medicine

## 2023-02-18 ENCOUNTER — Ambulatory Visit (INDEPENDENT_AMBULATORY_CARE_PROVIDER_SITE_OTHER): Payer: Worker's Compensation | Admitting: Sports Medicine

## 2023-02-18 ENCOUNTER — Encounter: Payer: Self-pay | Admitting: Sports Medicine

## 2023-02-18 DIAGNOSIS — M1711 Unilateral primary osteoarthritis, right knee: Secondary | ICD-10-CM

## 2023-02-18 DIAGNOSIS — M25561 Pain in right knee: Secondary | ICD-10-CM | POA: Diagnosis not present

## 2023-02-18 DIAGNOSIS — M2241 Chondromalacia patellae, right knee: Secondary | ICD-10-CM

## 2023-02-18 DIAGNOSIS — G8929 Other chronic pain: Secondary | ICD-10-CM | POA: Diagnosis not present

## 2023-02-18 NOTE — Progress Notes (Signed)
Jodi Hunt - 57 y.o. female MRN [id number]  Date of birth: [birth year]  Office Visit Note: Visit Date: 02/18/2023 PCP: Patient, No Pcp Per Referred by: No ref. provider found  Subjective: Chief Complaint  Patient presents with   Right Knee - Follow-up   HPI: Jodi Hunt is a pleasant 57 y.o. female who presents today for follow-up of chronic right knee pain.  As a reminder, did have a fall at work onto the knee on 06/05/2022.  A child darted in front of her and she fell forward onto her hands and knees.  Since that time, she has underwent formalized physical therapy, we did have 2 separate aspirations and injections.  He recently just completed a 3 of 3 series of Orthovisc, last on 01/27/2023.  Based on knee MRI, she does have advanced grade IV chondromalacia with patellofemoral arthritis of the right knee joint. Recently completed her 3 of 3 orthovisc injection series, last on 01/27/23.  Today Jodi Hunt does note that she has noticed some improvement with the viscosupplementation injection.  She still has some mild overlying swelling over the kneecap, although certainly improved from prior.  She still has difficulty with certain steps and going up and down on the knee.  Does not kneel on the knee.  She wears her neoprene compression sleeve for work as needed.  She has continued working without restrictions at this point.  Pertinent ROS were reviewed with the patient and found to be negative unless otherwise specified above in HPI.   Assessment & Plan: Visit Diagnoses:  1. Osteoarthritis of right patellofemoral joint   2. Chronic pain of right knee   3. Chondromalacia patellae, right knee    Plan: Discussed with Jodi Hunt today that it is reassuring she has gotten some improvement with the viscosupplementation injections.  She still does have a small amount of prepatellar fluid, this likely will be a chronic issue for her that waxes and wanes with activity.  We do know that  based on imaging and MRI that she does have grade 4 chondral malacia patella with patellofemoral arthritic change.  This was made worse from her initial fall, although the underlying condition has been present prior to the fall.  At this point, I do think she has reached maximal medical improvement, she does not have a PPI from my perspective. She is free to return to work without restrictions.  I did let Jodi Hunt and her case manager, Jodi Hunt, no that although she does not have a oermanent impairment rating, given the degree of her knee arthritis and chondromalacia, there may be times where her pain becomes exacerbated that may require treatment in the future.  She will follow-up with me as needed.  Continue neoprene compression sleeve, over-the-counter anti-inflammatories and/or Celebrex as needed.  Follow-up: Return if symptoms worsen or fail to improve.   Meds & Orders: No orders of the defined types were placed in this encounter.  No orders of the defined types were placed in this encounter.    Procedures: No procedures performed      Clinical History: No specialty comments available.  She reports that she has never smoked. She has never used smokeless tobacco. No results for input(s): "HGBA1C", "LABURIC" in the last 8760 hours.  Objective:    Physical Exam  Gen: Well-appearing, in no acute distress; non-toxic CV:  Well-perfused. Warm.  Resp: Breathing unlabored on room air; no wheezing. Psych: Fluid speech in conversation; appropriate affect; normal thought process Neuro: Sensation intact throughout. No  gross coordination deficits.   Ortho Exam - Right knee: Examination of the right knee does show a small protrusion overlying the patella, likely indicative of small prepatellar effusion.  There is no warmth or redness here.  There is patellar crepitus noted with flexion and extension, range of motion 0-130 degrees.  Mild TTP palpating around the medial aspect of the patellofemoral  joint, no specific tibiofemoral joint line TTP.  No varus or valgus instability. NVI.   Imaging:  - MRI w/o contrast R-knee, reveiwed on 12/12/22: Does show free edge irregularity of the lateral meniscus as well as significant patellofemoral arthritis. Bone-on-bone changes and grade IV chondromalacia present on that side along with some bone bruising indicating significant degenerative disease in that patellofemoral joint.   Past Medical/Family/Surgical/Social History: Medications & Allergies reviewed per EMR, new medications updated. There are no problems to display for this patient.  Past Medical History:  Diagnosis Date   Arthritis    Asthma    Hypertension    Family History  Problem Relation Age of Onset   Hyperlipidemia Mother    Hypertension Mother    Cancer Father    Diabetes Brother    Hypertension Brother    Past Surgical History:  Procedure Laterality Date   THYROIDECTOMY  39   Social History   Occupational History   Not on file  Tobacco Use   Smoking status: Never   Smokeless tobacco: Never  Substance and Sexual Activity   Alcohol use: No   Drug use: No   Sexual activity: Not on file   I spent 35 minutes in the care of the patient today including face-to-face time, preparation to see the patient, as well as review of the MRI; counseling and educating the patient on home exercise plan, bracing, additional treatment options; discussion on MMI and PPI with patient and [name], Case Manager, today for the above diagnoses.   Madelyn Brunner, DO Primary Care Sports Medicine Physician  [facility]  This note was dictated using Dragon naturally speaking software and may contain errors in syntax, spelling, or content which have not been identified prior to signing this note.

## 2023-02-18 NOTE — Progress Notes (Signed)
Doing good; states she can definitely tell a difference with the gel. Working regular duty; with no issues  Does have a Sports coach with her today

## 2023-03-17 ENCOUNTER — Ambulatory Visit: Payer: BLUE CROSS/BLUE SHIELD | Admitting: Sports Medicine
